# Patient Record
Sex: Female | Born: 1960 | Race: White | Hispanic: No | Marital: Married | Smoking: Never smoker
Health system: Southern US, Community
[De-identification: ages and names within clinical notes are randomized; demographics above are authoritative.]

## PROBLEM LIST (undated history)

## (undated) DIAGNOSIS — E039 Hypothyroidism, unspecified: Secondary | ICD-10-CM

## (undated) DIAGNOSIS — Z9289 Personal history of other medical treatment: Secondary | ICD-10-CM

## (undated) DIAGNOSIS — N3090 Cystitis, unspecified without hematuria: Secondary | ICD-10-CM

## (undated) DIAGNOSIS — R7303 Prediabetes: Secondary | ICD-10-CM

## (undated) DIAGNOSIS — E079 Disorder of thyroid, unspecified: Secondary | ICD-10-CM

## (undated) DIAGNOSIS — F419 Anxiety disorder, unspecified: Secondary | ICD-10-CM

## (undated) DIAGNOSIS — I10 Essential (primary) hypertension: Secondary | ICD-10-CM

## (undated) DIAGNOSIS — K219 Gastro-esophageal reflux disease without esophagitis: Secondary | ICD-10-CM

## (undated) DIAGNOSIS — G4730 Sleep apnea, unspecified: Secondary | ICD-10-CM

## (undated) DIAGNOSIS — F32A Depression, unspecified: Secondary | ICD-10-CM

## (undated) HISTORY — DX: Disorder of thyroid, unspecified: E07.9

## (undated) HISTORY — PX: CHOLECYSTECTOMY: SHX55

## (undated) HISTORY — DX: Cystitis, unspecified without hematuria: N30.90

---
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
## 1997-07-16 HISTORY — PX: ABDOMINAL HYSTERECTOMY: SHX81

## 2004-07-03 ENCOUNTER — Ambulatory Visit: Payer: Self-pay | Admitting: Obstetrics and Gynecology

## 2005-07-26 ENCOUNTER — Ambulatory Visit: Payer: Self-pay | Admitting: Obstetrics and Gynecology

## 2006-07-30 ENCOUNTER — Ambulatory Visit: Payer: Self-pay | Admitting: Obstetrics and Gynecology

## 2006-09-17 ENCOUNTER — Ambulatory Visit: Payer: Self-pay | Admitting: Family Medicine

## 2006-09-23 ENCOUNTER — Ambulatory Visit: Payer: Self-pay | Admitting: Surgery

## 2006-10-25 ENCOUNTER — Ambulatory Visit: Payer: Self-pay | Admitting: Gastroenterology

## 2007-04-09 ENCOUNTER — Ambulatory Visit: Payer: Self-pay | Admitting: Family Medicine

## 2007-05-21 ENCOUNTER — Ambulatory Visit: Payer: Self-pay | Admitting: Surgery

## 2007-08-05 ENCOUNTER — Ambulatory Visit: Payer: Self-pay | Admitting: Obstetrics and Gynecology

## 2008-08-06 ENCOUNTER — Ambulatory Visit: Payer: Self-pay | Admitting: Obstetrics and Gynecology

## 2009-08-11 ENCOUNTER — Ambulatory Visit: Payer: Self-pay | Admitting: Obstetrics and Gynecology

## 2009-08-18 ENCOUNTER — Ambulatory Visit: Payer: Self-pay | Admitting: Obstetrics and Gynecology

## 2010-02-21 ENCOUNTER — Ambulatory Visit: Payer: Self-pay | Admitting: Obstetrics and Gynecology

## 2010-08-15 ENCOUNTER — Ambulatory Visit: Payer: Self-pay | Admitting: Obstetrics and Gynecology

## 2010-12-07 ENCOUNTER — Ambulatory Visit: Payer: Self-pay | Admitting: Otolaryngology

## 2010-12-18 ENCOUNTER — Ambulatory Visit: Payer: Self-pay | Admitting: Otolaryngology

## 2011-07-17 HISTORY — PX: COLONOSCOPY: SHX174

## 2011-08-17 ENCOUNTER — Ambulatory Visit: Payer: Self-pay | Admitting: Obstetrics and Gynecology

## 2011-09-13 ENCOUNTER — Other Ambulatory Visit: Payer: Self-pay | Admitting: Gastroenterology

## 2011-09-13 LAB — CLOSTRIDIUM DIFFICILE BY PCR

## 2011-09-14 LAB — WBCS, STOOL

## 2011-09-15 LAB — STOOL CULTURE

## 2011-10-12 ENCOUNTER — Other Ambulatory Visit: Payer: Self-pay | Admitting: Gastroenterology

## 2011-11-12 ENCOUNTER — Ambulatory Visit: Payer: Self-pay | Admitting: Gastroenterology

## 2012-08-26 ENCOUNTER — Ambulatory Visit: Payer: Self-pay | Admitting: Obstetrics and Gynecology

## 2012-09-02 ENCOUNTER — Ambulatory Visit: Payer: Self-pay | Admitting: Obstetrics and Gynecology

## 2012-12-29 ENCOUNTER — Encounter: Payer: Self-pay | Admitting: General Surgery

## 2012-12-29 ENCOUNTER — Ambulatory Visit (INDEPENDENT_AMBULATORY_CARE_PROVIDER_SITE_OTHER): Payer: Managed Care, Other (non HMO) | Admitting: General Surgery

## 2012-12-29 ENCOUNTER — Other Ambulatory Visit: Payer: Self-pay

## 2012-12-29 VITALS — BP 128/74 | HR 76 | Resp 12 | Ht 69.0 in | Wt 207.0 lb

## 2012-12-29 DIAGNOSIS — N63 Unspecified lump in unspecified breast: Secondary | ICD-10-CM

## 2012-12-29 DIAGNOSIS — N6009 Solitary cyst of unspecified breast: Secondary | ICD-10-CM

## 2012-12-29 DIAGNOSIS — N6002 Solitary cyst of left breast: Secondary | ICD-10-CM

## 2012-12-29 NOTE — Patient Instructions (Addendum)
Patient to return for an left breast biopsy maybe right

## 2012-12-29 NOTE — Progress Notes (Signed)
Patient ID: [name], female   DOB: 1961-02-02, 52 y.o.   MRN: [id number]  Chief Complaint  Patient presents with  . Other    left breast ultrasound    HPI [name] is a 52 y.o. female here today for an left breast ultrasound. Patient had a left breast cyst aspiration in March 2014. Today's exam was to confirm if the area had recurred. Patient states she has a rash on her left nipple That began about 6-8 weeks ago but has significantly resolved for the last week.No family history of breast cancer. HPI  Past Medical History  Diagnosis Date  . Thyroid disease 1975  . Cystitis 1977    Past Surgical History  Procedure Laterality Date  . Colonoscopy  2013    Dr Frederick Peers polyps, FH of colon cancer   . Tonsillectomy  1966  . Abdominal hysterectomy  1999  . Breast biopsy Right 1972    Family History  Problem Relation Age of Onset  . Colon cancer Paternal Uncle 50    had 3 with colon ca  . Colon cancer Paternal Aunt 23  . Lung cancer Maternal Aunt 60  . Colon cancer Maternal Aunt 54    Social History History  Substance Use Topics  . Smoking status: Never Smoker   . Smokeless tobacco: Never Used  . Alcohol Use: Yes    No Known Allergies  Current Outpatient Prescriptions  Medication Sig Dispense Refill  . captopril-hydrochlorothiazide (CAPOZIDE) 25-15 MG per tablet Take 1 tablet by mouth daily.      [name] Kitchen FLUoxetine (PROZAC) 10 MG capsule Take 10 mg by mouth daily.      [name] Kitchen levothyroxine (SYNTHROID, LEVOTHROID) 137 MCG tablet Take 137 mcg by mouth daily before breakfast.       No current facility-administered medications for this visit.    Review of Systems Review of Systems  Constitutional: Negative.   Respiratory: Negative.   Cardiovascular: Negative.     Blood pressure 128/74, pulse 76, resp. rate 12, height 5\' 9"  (1.753 m), weight 207 lb (93.895 kg).  Physical Exam Physical Exam  Constitutional: She appears well-developed and well-nourished.  Cardiovascular:  Normal rate, regular rhythm and normal heart sounds.   Pulmonary/Chest: Breath sounds normal. Right breast exhibits no inverted nipple, no mass, no nipple discharge, no skin change and no tenderness. Left breast exhibits no inverted nipple, no mass, no nipple discharge, no skin change and no tenderness. Breasts are asymmetrical (right breast bigger than left).  Right breast thickening upper outer quadrant  A slight rash on the left areolar tissue in the 6-9 o'clock position. This is very faint, less the patient had identified the area I would not have appreciated. No skin thickening to suggest Paget's. No abnormality of the nipple.  Lymphadenopathy:    She has no cervical adenopathy.    She has no axillary adenopathy.  Neurological: She is alert.  Skin: Skin is warm and dry.    Data Reviewed Ultrasound examination of the right breast an area palpable fullness in the upper or quadrant showed an ill-defined isoechoic area with a heterogeneous echo pattern resting against the pectoralis fascia. This was approximately 12 cm from the nipple and measured 1.23 x 2.23 x 2.42 cm. There was no architectural distortion. The image was not necessarily that of a lipoma or a lymph node. In the right breast at the 1:00 position 10 cm from nipple to focal areas of hyperechoic tissue were identified, the largest measuring less than 8 mm in  diameter.  In the left breast at the 12:00 position 6 cm from the nipple at the site of the previous cystic aspiration was a irregular hypoechoic mass with posterior acoustic enhancement measuring 0.47 x 0.63 x 0.79 cm. Internal echos were appreciated.at the time of the patient's September 17, 2012 exam this area measured 0.37 x 0.61 x 0.66 cm.  Assessment    Recurrent cystic lesion the left breast.  New, ill-defined mass in the right breast.     Plan    Because of the recurrence of left breast cyst and a newly palpable right breast lesion bilateral vacuum biopsies been  recommended.        Earline Mayotte 12/30/2012, 7:50 PM

## 2012-12-30 ENCOUNTER — Encounter: Payer: Self-pay | Admitting: General Surgery

## 2012-12-30 DIAGNOSIS — N63 Unspecified lump in unspecified breast: Secondary | ICD-10-CM | POA: Insufficient documentation

## 2013-01-14 ENCOUNTER — Encounter: Payer: Self-pay | Admitting: General Surgery

## 2013-01-14 ENCOUNTER — Ambulatory Visit (INDEPENDENT_AMBULATORY_CARE_PROVIDER_SITE_OTHER): Payer: Managed Care, Other (non HMO) | Admitting: General Surgery

## 2013-01-14 VITALS — BP 120/78 | HR 80 | Resp 14 | Ht 63.5 in | Wt 215.0 lb

## 2013-01-14 DIAGNOSIS — N6002 Solitary cyst of left breast: Secondary | ICD-10-CM

## 2013-01-14 DIAGNOSIS — N63 Unspecified lump in unspecified breast: Secondary | ICD-10-CM | POA: Insufficient documentation

## 2013-01-14 DIAGNOSIS — N6009 Solitary cyst of unspecified breast: Secondary | ICD-10-CM

## 2013-01-14 HISTORY — PX: BREAST BIOPSY: SHX20

## 2013-01-14 NOTE — Progress Notes (Signed)
Patient ID: [name], female   DOB: 05/22/1961, 52 y.o.   MRN: [id number]  Chief Complaint  Patient presents with  . Procedure    bilateral breast biopsy - encore    HPI [name] is a 52 y.o. female who presents for a bilateral breast biopsy. The left breast has a recurrent complex cystic lesion, and the right breast showed a ill-defined mass in the upper-outer quadrant.  HPI  Past Medical History  Diagnosis Date  . Thyroid disease 1975  . Cystitis 1977    Past Surgical History  Procedure Laterality Date  . Colonoscopy  2013    Dr Frederick Peers polyps, FH of colon cancer   . Tonsillectomy  1966  . Abdominal hysterectomy  1999  . Breast biopsy Right 1972    Family History  Problem Relation Age of Onset  . Colon cancer Paternal Uncle 50    had 3 with colon ca  . Colon cancer Paternal Aunt 73  . Lung cancer Maternal Aunt 60  . Colon cancer Maternal Aunt 61    Social History History  Substance Use Topics  . Smoking status: Never Smoker   . Smokeless tobacco: Never Used  . Alcohol Use: Yes    Allergies  Allergen Reactions  . Cephalosporins Hives  . Sulfa Antibiotics Other (See Comments)    dizziness    Current Outpatient Prescriptions  Medication Sig Dispense Refill  . FLUoxetine (PROZAC) 10 MG capsule Take 10 mg by mouth daily.      Marland Kitchen ibuprofen (ADVIL,MOTRIN) 200 MG tablet Take 400 mg by mouth every 6 (six) hours as needed for pain.      Marland Kitchen levothyroxine (SYNTHROID, LEVOTHROID) 137 MCG tablet Take 137 mcg by mouth daily before breakfast.      . triamterene-hydrochlorothiazide (MAXZIDE-25) 37.5-25 MG per tablet Take 1 tablet by mouth daily.       No current facility-administered medications for this visit.    Review of Systems Review of Systems  Constitutional: Negative.   Respiratory: Negative.   Cardiovascular: Negative.     Blood pressure 120/78, pulse 80, resp. rate 14, height 5' 3.5" (1.613 m), weight 215 lb (97.523 kg).  Physical Exam Physical  Exam No interval change. Data Reviewed Ultrasound examination of the right breast 11:00 position 10 cm from the nipple showed a hypoechoic area measuring 1.2 x 1.3 x 1.9 cm.  10 cc of 0.5% Xylocaine with 0.25% Marcaine with 1-200,000 epinephrine was used for this lesion. Chlor prep was applied to the skin. The right breast was approached first. Under ultrasound guidance the 10-gauge Encor device was placed into the lesion and 12 core samples obtained. Scant bleeding was noted. A postbiopsy clip was placed. The skin defect was closed with benzoin and Steri-Strips followed by Telfa and Tegaderm dressing.  Ultrasound examination ofthe left breast 6 cm from the nipple to 2:00 position a 0.6 x 0.7 x 0.8 cm complex cystic lesion was identified.    10 cc of 0.5% Xylocaine with 0.25% Marcaine with 1-200,000 epinephrine was utilized well tolerated. A small hematoma was evident after the injection of local anesthesia and prior to biopsy and the subcutaneous tissue. The area was prepped with ChloraPrep draped. The 10-gauge Encor device (new handpiece) was advanced through the lesion. 6 core samples were obtained with complete removal. A postbiopsy clip was placed. The skin defect was closed with benzoin and Steri-Strips followed by Telfa and Tegaderm dressing. The patient tolerated the procedure well and was provided with postbiopsy written instructions.  Assessment    Complex cyst left breast, ill-defined mass right breast.     Plan    The patient will be contacted with pathology when available.        Earline Mayotte 01/14/2013, 9:27 PM   Patient returned for left breast dressing change.  Mild hematoma noted area cleaned and redressed with Telfa and Tegaderm. Aware to use ice today and tomorrow then she may switch to heat for comfort. Follow up as scheduled.

## 2013-01-14 NOTE — Patient Instructions (Addendum)

## 2013-01-15 ENCOUNTER — Telehealth: Payer: Self-pay | Admitting: General Surgery

## 2013-01-15 LAB — PATHOLOGY

## 2013-01-15 NOTE — Telephone Encounter (Signed)
The patient was notified that both right and left breast biopsies were benign. She reported no problems with the procedure. She will follow up next week with the nurse for a wound check.

## 2013-01-21 ENCOUNTER — Ambulatory Visit (INDEPENDENT_AMBULATORY_CARE_PROVIDER_SITE_OTHER): Payer: Managed Care, Other (non HMO) | Admitting: *Deleted

## 2013-01-21 DIAGNOSIS — N6002 Solitary cyst of left breast: Secondary | ICD-10-CM

## 2013-01-21 DIAGNOSIS — N6009 Solitary cyst of unspecified breast: Secondary | ICD-10-CM

## 2013-01-21 NOTE — Patient Instructions (Addendum)
The patient is aware that a heating pad may be used for comfort as needed.  Aware of pathology. Follow up as scheduled. 

## 2013-01-21 NOTE — Progress Notes (Addendum)
Patient here today for follow up post bilateral breast biopsy.  No dressing, steristrip in place and aware it may come off in one week.  Minimal bruising noted to right breast. Left breast with moderate bruising/hematoma  The patient is aware that a heating pad may be used for comfort as needed.  Aware of pathology. Follow up as scheduled.

## 2013-02-10 ENCOUNTER — Encounter: Payer: Self-pay | Admitting: General Surgery

## 2013-05-21 ENCOUNTER — Other Ambulatory Visit: Payer: Self-pay

## 2013-08-14 ENCOUNTER — Ambulatory Visit: Payer: Self-pay | Admitting: General Surgery

## 2013-08-17 ENCOUNTER — Encounter: Payer: Self-pay | Admitting: General Surgery

## 2013-08-24 ENCOUNTER — Other Ambulatory Visit: Payer: Managed Care, Other (non HMO)

## 2013-08-24 ENCOUNTER — Encounter: Payer: Self-pay | Admitting: General Surgery

## 2013-08-24 ENCOUNTER — Ambulatory Visit (INDEPENDENT_AMBULATORY_CARE_PROVIDER_SITE_OTHER): Payer: Managed Care, Other (non HMO) | Admitting: General Surgery

## 2013-08-24 VITALS — BP 150/80 | HR 78 | Resp 14 | Ht 63.0 in | Wt 210.0 lb

## 2013-08-24 DIAGNOSIS — N63 Unspecified lump in unspecified breast: Secondary | ICD-10-CM

## 2013-08-24 NOTE — Progress Notes (Addendum)
Patient ID: [name], female   DOB: 1961/01/02, 53 y.o.   MRN: [id number]  Chief Complaint  Patient presents with  . Follow-up    mammogram    HPI [name] is a 53 y.o. female who presents for a breast evaluation. The most recent mammogram and ultrasound was done on 08/14/13.Patient does perform regular self breast checks and gets regular mammograms done.  States she still has an rash and redness around her areolar tissue bilaterally that waxes and wanes in intensity. This is much less prominent today than it was last week by the patient and husband's report.  HPI  Past Medical History  Diagnosis Date  . Thyroid disease 1975  . Cystitis 1977    Past Surgical History  Procedure Laterality Date  . Colonoscopy  2013    Dr Frederick Peers polyps, FH of colon cancer   . Tonsillectomy  1966  . Abdominal hysterectomy  1999  . Breast biopsy Right 1972    Family History  Problem Relation Age of Onset  . Colon cancer Paternal Uncle 50    had 3 with colon ca  . Colon cancer Paternal Aunt 54  . Lung cancer Maternal Aunt 60  . Colon cancer Maternal Aunt 41    Social History History  Substance Use Topics  . Smoking status: Never Smoker   . Smokeless tobacco: Never Used  . Alcohol Use: Yes    Allergies  Allergen Reactions  . Cephalosporins Hives  . Sulfa Antibiotics Other (See Comments)    dizziness    Current Outpatient Prescriptions  Medication Sig Dispense Refill  . ESTRACE VAGINAL 0.1 MG/GM vaginal cream       . FLUoxetine (PROZAC) 10 MG capsule Take 10 mg by mouth daily.      Marland Kitchen ibuprofen (ADVIL,MOTRIN) 200 MG tablet Take 400 mg by mouth every 6 (six) hours as needed for pain.      Marland Kitchen levothyroxine (SYNTHROID, LEVOTHROID) 137 MCG tablet Take 137 mcg by mouth daily before breakfast.      . triamterene-hydrochlorothiazide (MAXZIDE-25) 37.5-25 MG per tablet Take 1 tablet by mouth daily.       No current facility-administered medications for this visit.    Review of  Systems Review of Systems  Constitutional: Negative.   Respiratory: Negative.   Cardiovascular: Negative.     Blood pressure 150/80, pulse 78, resp. rate 14, height 5\' 3"  (1.6 m), weight 210 lb (95.255 kg).  Physical Exam Physical Exam  Constitutional: She is oriented to person, place, and time. She appears well-developed and well-nourished.  Eyes: Conjunctivae are normal.  Neck: Neck supple.  Cardiovascular: Normal rate, regular rhythm and normal heart sounds.   Pulmonary/Chest: Breath sounds normal. Right breast exhibits no inverted nipple, no mass, no nipple discharge, no skin change and no tenderness. Left breast exhibits no inverted nipple, no mass, no nipple discharge, no skin change and no tenderness.  There is a vague, patchy faintly erythematous rash without associated skin changes involving 6-7 cm area around the areola bilaterally. Once again this is very faint.  Lymphadenopathy:    She has no cervical adenopathy.    She has no axillary adenopathy.  Neurological: She is alert and oriented to person, place, and time.  Skin: Skin is warm and dry.    Data Reviewed PATH REPORT.SITE OF ORIGIN SPEC  Comment   Comments: Material submitted: Marland Kitchen PART A: RIGHT BREAST 11:00 PART B: LEFT BREAST 12:00 PATH REPORT.FINAL DX SPEC  Comment   Comments: Clinician provided ICD-9:  611.72 ; Lump or mass in breast PATH REPORT.FINAL DX SPEC  Comment   Comments:  Diagnosis: Part A: RIGHT BREAST AT 11 O'CLOCK, ULTRASOUND-GUIDED VACUUM- ASSISTED CORE BIOPSY: - FIBROTIC BREAST TISSUE WITH BENIGN PROLIFERATIVE CHANGES. - NO ATYPIA OR MALIGNANCY. Part B: LEFT BREAST AT 12 O'CLOCK, ULTRASOUND-GUIDED VACUUM- ASSISTED CORE BIOPSY: - CLUSTERS OF APOCRINE MICROCYSTS IN 2 PIECES, 3 MM AND 2 MM. - NO ATYPIA OR MALIGNANCY. NOTE: In A there is usual ductal hyperplasia and columnar cell hyperplasia, as well as a few dilated sclerotic ducts and a few apocrine microcysts. In B there is mild usual ductal  hyperplasia. The findings were discussed with Dr. Lemar LivingsByrnett on 01/15/13 at 2:25 PM. MSO/01/15/2013 Mammograms were reviewed. The radiologist reports a persistent asymmetric density in the left breast the 12:00 position adjacent to the previous biopsy clip. No mammographic abnormality of the right breast was reported.  Ultrasound report dated August 14, 2013 was reviewed. Radiologist stated that the patient undergone core needle biopsies. This is incorrect. The patient underwent vacuum-assisted biopsy with a Encor device, 9-gauge samples.  The radiologist was concerned regarding ultrasound abnormality in the 12:00 position the left breast which was by her determination larger. She did not take into account the hematoma the patient experienced after her previous core biopsy with benign findings.  The right breast reportedly shows a 2 x 5 x 5 mm hypoechoic nodule at 11:30 o'clock possibly representing a cyst. Recommendations are confusing as it's unclear whether she is recommending biopsy of right, left or both breasts. BI-RAD-4.  Ultrasound exami nation of the left breast (no charge) in the 12:00 position 6 cm from the nipple shows a 0.4 x 1.0 x 1.0 cm hypoechoic area as described in the radiologist report. This corresponds in space to the  location of the previously completed 9-gauge core biopsy with benign findings as noted above. This is likely residual changes from the previous hematoma.   Assessment    Benign breast exam.  Intermittent rash involving the nipple/areolar area, no evidence of Paget's disease.    Plan    Options for management were reviewed: 1) repeat biopsy as recommended by the radiologist versus 2) follow up ultrasound examination and 3-6 months to assure stability of the area thought to represent residual hematoma. Pros and cons of both were reviewed with the patient and her husband. Short-term follow up as acceptable the patient, arrangements were made for office follow up  ultrasound in 3 months. She was advised that should she desire early biopsy she should notify the office and this will be arranged.   The radiologist will be asked to clarify her recommendations in regards to benign right breast mammograms on her ultrasound report of a 5 mm lesion.  Dermatologic assessment has been recommended.       Earline MayotteByrnett, Carren Blakley W 08/24/2013, 8:38 PM   I spoke with the attending by phone regarding her recommendations regarding the right breast. She reported to me that if the left breast biopsy was found to be malignant, she would investigate the right breast lesion earlier, prior to any treatment, but otherwise observation was appropriate. This will be completed through the office.

## 2013-08-24 NOTE — Patient Instructions (Signed)
Patient to return in three months.  

## 2013-08-31 ENCOUNTER — Telehealth: Payer: Self-pay | Admitting: General Surgery

## 2013-08-31 NOTE — Telephone Encounter (Signed)
Right/ left error in body of noted identified by patient. Corrected.  Images were of the LEFT breast.  F/U as scheduled.

## 2013-11-23 ENCOUNTER — Ambulatory Visit: Payer: Managed Care, Other (non HMO) | Admitting: General Surgery

## 2013-11-25 ENCOUNTER — Encounter: Payer: Self-pay | Admitting: General Surgery

## 2013-11-25 ENCOUNTER — Ambulatory Visit (INDEPENDENT_AMBULATORY_CARE_PROVIDER_SITE_OTHER): Payer: Managed Care, Other (non HMO) | Admitting: General Surgery

## 2013-11-25 ENCOUNTER — Other Ambulatory Visit: Payer: Managed Care, Other (non HMO)

## 2013-11-25 VITALS — BP 112/66 | HR 70 | Resp 12 | Ht 65.0 in | Wt 199.0 lb

## 2013-11-25 DIAGNOSIS — N63 Unspecified lump in unspecified breast: Secondary | ICD-10-CM

## 2013-11-25 NOTE — Patient Instructions (Signed)
Continue self breast exams. Call office for any new breast issues or concerns. 

## 2013-11-25 NOTE — Progress Notes (Addendum)
Patient ID: [name], female   DOB: 16-Aug-1960, 53 y.o.   MRN: [id number]  Chief Complaint  Patient presents with  . Follow-up    office ultrasound    HPI Melissa KnackSonia Nichols is a 53 y.o. female.  Here today for follow up left breast office ultrasound. No new breast issues.   HPI  Past Medical History  Diagnosis Date  . Thyroid disease 1975  . Cystitis 1977    Past Surgical History  Procedure Laterality Date  . Colonoscopy  2013    Dr Frederick Peersh-no polyps, FH of colon cancer   . Tonsillectomy  1966  . Abdominal hysterectomy  1999  . Breast biopsy Right 1972    Family History  Problem Relation Age of Onset  . Colon cancer Paternal Uncle 50    had 3 with colon ca  . Colon cancer Paternal Aunt 7580  . Lung cancer Maternal Aunt 60  . Colon cancer Maternal Aunt 5760    Social History History  Substance Use Topics  . Smoking status: Never Smoker   . Smokeless tobacco: Never Used  . Alcohol Use: Yes    Allergies  Allergen Reactions  . Cephalosporins Hives  . Sulfa Antibiotics Other (See Comments)    dizziness    Current Outpatient Prescriptions  Medication Sig Dispense Refill  . FLUoxetine (PROZAC) 10 MG capsule Take 10 mg by mouth daily.      Marland Kitchen. ibuprofen (ADVIL,MOTRIN) 200 MG tablet Take 400 mg by mouth every 6 (six) hours as needed for pain.      Marland Kitchen. levothyroxine (SYNTHROID, LEVOTHROID) 137 MCG tablet Take 137 mcg by mouth daily before breakfast.      . triamterene-hydrochlorothiazide (MAXZIDE-25) 37.5-25 MG per tablet Take 1 tablet by mouth daily.       No current facility-administered medications for this visit.    Review of Systems Review of Systems  Constitutional: Negative.   Respiratory: Negative.   Cardiovascular: Negative.     Blood pressure 112/66, pulse 70, resp. rate 12, height 5\' 5"  (1.651 m), weight 199 lb (90.266 kg).  Physical Exam Physical Exam  Constitutional: She is oriented to person, place, and time. She appears well-developed and well-nourished.   Neck: Neck supple. No thyromegaly present.  Cardiovascular: Normal rate, regular rhythm and normal heart sounds.   No murmur heard. Pulmonary/Chest: Effort normal and breath sounds normal. Right breast exhibits no inverted nipple, no mass, no nipple discharge, no skin change and no tenderness. Left breast exhibits no inverted nipple, no mass, no nipple discharge, no skin change and no tenderness.    Plate like thickening upper inner quadrant 10 cm from nipple of right breast.   Plate like thickening upper inner quadrant of left breast.     Lymphadenopathy:    She has no cervical adenopathy.    She has no axillary adenopathy.  Neurological: She is alert and oriented to person, place, and time.  Skin: Skin is warm and dry.    Data Reviewed Ultrasound examination of the left breast in the upper inner quadrant an area of focal thickening showed hyper-echoic tissue as well as isoechoic tissue just below the level of the dermis. In aggregate this measured at 1.5 x 1.3 cm in maximal diameter, 0.5 cm in thickness. Vascular flow adjacent to this there is appreciated.  At the 12:00 position at the site of previous biopsy a 0.3 x 0.4 x 0.5 cm hypoechoic area within acoustic enhancement is noted.  Assessment    Stable breast exam.  Plan    We'll plan for a followup examination with bilateral mammograms in 8 months.     PCP: Bettey MareBabaoff, Marc E   Crue Otero W Maiyah Goyne 11/26/2013, 1:05 PM

## 2014-05-17 ENCOUNTER — Encounter: Payer: Self-pay | Admitting: General Surgery

## 2014-08-16 ENCOUNTER — Ambulatory Visit: Payer: Self-pay | Admitting: General Surgery

## 2014-08-17 ENCOUNTER — Encounter: Payer: Self-pay | Admitting: General Surgery

## 2014-08-19 ENCOUNTER — Telehealth: Payer: Self-pay | Admitting: *Deleted

## 2014-08-19 NOTE — Telephone Encounter (Signed)
-----   Message from Melissa MayotteJeffrey W [name], MD sent at 08/19/2014  7:22 AM EST ----- Notify the patient I reviewed her mammograms and repeat [facility] ultrasound. She may benefit from repeat left biopsy.  Can be completed this afternoon, tomorrow or at the time of her scheduled appointment next week.

## 2014-08-19 NOTE — Telephone Encounter (Signed)
Notified patient as instructed, biopsy at next appt, patient agrees

## 2014-08-24 ENCOUNTER — Ambulatory Visit (INDEPENDENT_AMBULATORY_CARE_PROVIDER_SITE_OTHER): Payer: Managed Care, Other (non HMO) | Admitting: General Surgery

## 2014-08-24 ENCOUNTER — Other Ambulatory Visit: Payer: Managed Care, Other (non HMO)

## 2014-08-24 ENCOUNTER — Encounter: Payer: Self-pay | Admitting: General Surgery

## 2014-08-24 VITALS — BP 122/68 | HR 78 | Resp 16 | Ht 65.0 in | Wt 205.0 lb

## 2014-08-24 DIAGNOSIS — N6320 Unspecified lump in the left breast, unspecified quadrant: Secondary | ICD-10-CM

## 2014-08-24 DIAGNOSIS — N63 Unspecified lump in breast: Secondary | ICD-10-CM

## 2014-08-24 NOTE — Patient Instructions (Signed)
Follow up here in 6 months for office ultrasound.

## 2014-08-24 NOTE — Progress Notes (Signed)
Patient ID: [name], female   DOB: 1961-06-09, 54 y.o.   MRN: [id number]  Chief Complaint  Patient presents with  . Procedure    left breast biopsy    HPI [name] is a 54 y.o. female here today for a follow up from mammogram and ultrasound done at [facility] on 08/16/14. HPI  Past Medical History  Diagnosis Date  . Thyroid disease 1975  . Cystitis 1977    Past Surgical History  Procedure Laterality Date  . Colonoscopy  2013    Dr Frederick Peers polyps, FH of colon cancer   . Tonsillectomy  1966  . Abdominal hysterectomy  1999  . Breast biopsy Right 1972    Family History  Problem Relation Age of Onset  . Colon cancer Paternal Uncle 50    had 3 with colon ca  . Colon cancer Paternal Aunt 32  . Lung cancer Maternal Aunt 60  . Colon cancer Maternal Aunt 23    Social History History  Substance Use Topics  . Smoking status: Never Smoker   . Smokeless tobacco: Never Used  . Alcohol Use: Yes    Allergies  Allergen Reactions  . Cephalosporins Hives  . Sulfa Antibiotics Other (See Comments)    dizziness    Current Outpatient Prescriptions  Medication Sig Dispense Refill  . FLUoxetine (PROZAC) 10 MG capsule Take 10 mg by mouth daily.    Marland Kitchen ibuprofen (ADVIL,MOTRIN) 200 MG tablet Take 400 mg by mouth every 6 (six) hours as needed for pain.    Marland Kitchen levothyroxine (SYNTHROID, LEVOTHROID) 137 MCG tablet Take 137 mcg by mouth daily before breakfast.    . triamterene-hydrochlorothiazide (MAXZIDE-25) 37.5-25 MG per tablet Take 1 tablet by mouth daily.     No current facility-administered medications for this visit.    Review of Systems Review of Systems  Constitutional: Negative.   Respiratory: Negative.   Cardiovascular: Negative.     Blood pressure 122/68, pulse 78, resp. rate 16, height  (1.651 m), weight 205 lb (92.987 kg).  Physical Exam Physical Exam  Constitutional: She is oriented to person, place, and time. She appears well-developed and well-nourished.   Eyes: Conjunctivae are normal. No scleral icterus.  Neck: Neck supple.  Cardiovascular: Normal rate, regular rhythm and normal heart sounds.   Pulmonary/Chest: Breath sounds normal. Right breast exhibits no inverted nipple, no mass, no nipple discharge, no skin change and no tenderness. Left breast exhibits no inverted nipple, no nipple discharge, no skin change and no tenderness. Breasts are asymmetrical (Left breast 1/2 cup sizes bigger than right ).  Abdominal: Soft. Bowel sounds are normal. There is no tenderness.  Lymphadenopathy:    She has no cervical adenopathy.    She has no axillary adenopathy.  Neurological: She is alert and oriented to person, place, and time.  Skin: Skin is warm and dry.    Data Reviewed 08/16/2014 mammogram and ultrasound completed at [facility] was reviewed and compared to previous studies. Mammograms essentially unchanged. Ultrasound suggested slight enlargement of the ill-defined hypoechoic area. BI-RADS-4.  Repeat ultrasound of the left breast shows a 0.5 x 0.9 x 1.6 cm  irregular hypoechoic area with posterior acoustic enhancement.   No corresponding mammographic abnormality is noted at the 12:00 position, 6 cm from the nipple. This is adjacent to the area of the previously placed clip. That procedure did result in a small hematoma clinically evident after the biopsy was completed. At the time of her May 2005 examination this measured less than  1 cm in diameter. BI-RADS-3.  Assessment    Ill-defined ultrasound area at the site of previous biopsy showing microcysts.    Plan    Options for management were reviewed: 1) early repeat ultrasound-guided biopsy versus 2) repeat ultrasound examination in 6 months. The pros and cons of each were reviewed. At this time the patient is comfortable with a 6 month follow-up. If she does have a change of heart she was encouraged to call and we'll arrange for office biopsy. She will follow up here in 6 months with a Left breast  ultrasound.     PCP:  Dewitt HoesBabaoff, Marcus Elijah  Chaquetta Schlottman W 08/25/2014, 5:10 PM

## 2015-01-12 ENCOUNTER — Encounter: Payer: Self-pay | Admitting: *Deleted

## 2015-02-22 ENCOUNTER — Ambulatory Visit (INDEPENDENT_AMBULATORY_CARE_PROVIDER_SITE_OTHER): Payer: Managed Care, Other (non HMO) | Admitting: General Surgery

## 2015-02-22 ENCOUNTER — Ambulatory Visit: Payer: Managed Care, Other (non HMO)

## 2015-02-22 ENCOUNTER — Encounter: Payer: Self-pay | Admitting: General Surgery

## 2015-02-22 VITALS — BP 124/78 | HR 74 | Resp 12 | Ht 65.0 in | Wt 207.0 lb

## 2015-02-22 DIAGNOSIS — N6320 Unspecified lump in the left breast, unspecified quadrant: Secondary | ICD-10-CM

## 2015-02-22 DIAGNOSIS — N63 Unspecified lump in breast: Secondary | ICD-10-CM | POA: Diagnosis not present

## 2015-02-22 NOTE — Patient Instructions (Addendum)
Patient will be asked to return to the office in  Six months with a bilateral screening mammogra

## 2015-02-22 NOTE — Progress Notes (Signed)
Patient ID: [name], female   DOB: Feb 28, 1961, 54 y.o.   MRN: [id number]  Chief Complaint  Patient presents with  . Follow-up    left breast ultrasound    HPI [name] is a 54 y.o. female here today for a left breast ultrasound . Patient states she is doing well at this time.  HPI  Past Medical History  Diagnosis Date  . Thyroid disease 1975  . Cystitis 1977    Past Surgical History  Procedure Laterality Date  . Colonoscopy  2013    Dr Frederick Peers polyps, FH of colon cancer   . Tonsillectomy  1966  . Abdominal hysterectomy  1999  . Breast biopsy Right 1972    Family History  Problem Relation Age of Onset  . Colon cancer Paternal Uncle 50    had 3 with colon ca  . Colon cancer Paternal Aunt 81  . Lung cancer Maternal Aunt 60  . Colon cancer Maternal Aunt 61    Social History History  Substance Use Topics  . Smoking status: Never Smoker   . Smokeless tobacco: Never Used  . Alcohol Use: Yes    Allergies  Allergen Reactions  . Cephalosporins Hives  . Sulfa Antibiotics Other (See Comments)    dizziness    Current Outpatient Prescriptions  Medication Sig Dispense Refill  . FLUoxetine (PROZAC) 10 MG capsule Take 10 mg by mouth daily.    Marland Kitchen ibuprofen (ADVIL,MOTRIN) 200 MG tablet Take 400 mg by mouth every 6 (six) hours as needed for pain.    Marland Kitchen levothyroxine (SYNTHROID, LEVOTHROID) 137 MCG tablet Take 137 mcg by mouth daily before breakfast.    . triamterene-hydrochlorothiazide (MAXZIDE-25) 37.5-25 MG per tablet Take 1 tablet by mouth daily.     No current facility-administered medications for this visit.    Review of Systems Review of Systems  Blood pressure 124/78, pulse 74, resp. rate 12, height 5\' 5"  (1.651 m), weight 207 lb (93.895 kg).  Physical Exam Physical Exam  Constitutional: She is oriented to person, place, and time. She appears well-developed and well-nourished.  Eyes: Conjunctivae are normal. No scleral icterus.  Neck: Neck supple.   Cardiovascular: Normal rate, regular rhythm and normal heart sounds.   Pulmonary/Chest: Effort normal and breath sounds normal. Right breast exhibits no inverted nipple, no mass, no nipple discharge, no skin change and no tenderness. Left breast exhibits no inverted nipple, no nipple discharge, no skin change and no tenderness.    Lymphadenopathy:    She has no cervical adenopathy.    She has no axillary adenopathy.  Neurological: She is alert and oriented to person, place, and time.  Skin: Skin is warm and dry.    Data Reviewed Repeat ultrasound was completed to reassess the area of previous biopsy. In the past this measured up to 1.6 cm an area of architectural distortion.  Examination of the left breast at the 12:00 position shows a marked decrease in the size of architectural distortion 6 cm from the nipple. Today this hypoechoic area with faint acoustic shadowing measures 0.47 x 0.51 x 0.62 cm. Just cephalad to this 10 cm from the nipple a small complex cyst measuring up to 0.34 cm is noted as well as a minimally prominent duct measuring 0.11 cm in diameter and in greatest dimension 0.4 cm in length. No vascular flow was noted in any of the lesions. No axillary adenopathy was identified. BI-RADS-3.  Assessment    Continuing resolution of postbiopsy changes.  Plan    We'll plan for diagnostic mammograms in 6 months with a repeat office visit at that time. PCP:  Joneen Roach 02/22/2015, 12:39 PM

## 2015-06-29 ENCOUNTER — Other Ambulatory Visit: Payer: Self-pay | Admitting: *Deleted

## 2015-06-29 DIAGNOSIS — N63 Unspecified lump in unspecified breast: Secondary | ICD-10-CM

## 2015-07-06 ENCOUNTER — Encounter: Payer: Self-pay | Admitting: *Deleted

## 2015-07-17 HISTORY — PX: BREAST BIOPSY: SHX20

## 2015-08-18 ENCOUNTER — Ambulatory Visit
Admission: RE | Admit: 2015-08-18 | Discharge: 2015-08-18 | Disposition: A | Discharge status: Home / Self Care | Payer: Managed Care, Other (non HMO) | Source: Ambulatory Visit | Attending: General Surgery | Admitting: General Surgery

## 2015-08-18 ENCOUNTER — Telehealth: Payer: Self-pay | Admitting: *Deleted

## 2015-08-18 ENCOUNTER — Other Ambulatory Visit: Payer: Self-pay | Admitting: General Surgery

## 2015-08-18 DIAGNOSIS — N63 Unspecified lump in unspecified breast: Secondary | ICD-10-CM

## 2015-08-18 NOTE — Telephone Encounter (Signed)
Patient was advised and verbalized understanding, she wishes to discuss all options at time of her appointment next week on 08/24/15.

## 2015-08-18 NOTE — Telephone Encounter (Signed)
-----   Message from Melissa Mayotte, MD sent at 08/18/2015  1:26 PM EST ----- These notify the patient that I reviewed the recent mammograms and ultrasound. They have recommended a repeat biopsy, and this can be completed at her office visit scheduled on February 8 of she desires. We will certainly discuss her options at that time.

## 2015-08-24 ENCOUNTER — Other Ambulatory Visit: Payer: Self-pay

## 2015-08-24 ENCOUNTER — Ambulatory Visit (INDEPENDENT_AMBULATORY_CARE_PROVIDER_SITE_OTHER): Payer: Managed Care, Other (non HMO) | Admitting: General Surgery

## 2015-08-24 VITALS — BP 128/76 | HR 88 | Resp 14 | Ht 67.0 in | Wt 208.0 lb

## 2015-08-24 DIAGNOSIS — N6320 Unspecified lump in the left breast, unspecified quadrant: Secondary | ICD-10-CM

## 2015-08-24 DIAGNOSIS — N63 Unspecified lump in breast: Secondary | ICD-10-CM

## 2015-08-24 NOTE — Patient Instructions (Signed)

## 2015-08-24 NOTE — Progress Notes (Signed)
Patient ID: [name], female   DOB: 1961/01/29, 55 y.o.   MRN: [id number]  Chief Complaint  Patient presents with  . Follow-up    mammogram    HPI [name] is a 55 y.o. female who presents for a breast evaluation. The most recent mammogram and ultrasound was done on 08/18/15.  Patient does perform regular self breast checks and gets regular mammograms done.  Patient states she had a rash around both nipple about two weeks ago and was given some cream from her doctor and the areas have cleared up. I personally reviewed the patient's history. HPI  Past Medical History  Diagnosis Date  . Thyroid disease 1975  . Cystitis 1977    Past Surgical History  Procedure Laterality Date  . Colonoscopy  2013    Dr Frederick Peers polyps, FH of colon cancer   . Tonsillectomy  1966  . Abdominal hysterectomy  1999  . Breast biopsy Right 1972    neg  . Breast biopsy Right 01/14/2013    neg  . Breast biopsy Left 01/14/2013    neg    Family History  Problem Relation Age of Onset  . Colon cancer Paternal Uncle 50    had 3 with colon ca  . Colon cancer Paternal Aunt 34  . Lung cancer Maternal Aunt 60  . Colon cancer Maternal Aunt 36    Social History Social History  Substance Use Topics  . Smoking status: Never Smoker   . Smokeless tobacco: Never Used  . Alcohol Use: Yes    Allergies  Allergen Reactions  . Cephalosporins Hives  . Sulfa Antibiotics Other (See Comments)    dizziness    Current Outpatient Prescriptions  Medication Sig Dispense Refill  . cholecalciferol (VITAMIN D) 1000 units tablet Take 1,000 Units by mouth daily.    Marland Kitchen FLUoxetine (PROZAC) 10 MG capsule Take 10 mg by mouth daily.    Marland Kitchen ibuprofen (ADVIL,MOTRIN) 200 MG tablet Take 400 mg by mouth every 6 (six) hours as needed for pain.    Marland Kitchen levothyroxine (SYNTHROID, LEVOTHROID) 137 MCG tablet Take 137 mcg by mouth daily before breakfast.    . Multiple Vitamins-Minerals (MULTIVITAMIN WITH MINERALS) tablet Take 1 tablet by  mouth daily.    Marland Kitchen triamcinolone ointment (KENALOG) 0.1 %     . triamterene-hydrochlorothiazide (MAXZIDE-25) 37.5-25 MG per tablet Take 1 tablet by mouth daily.     No current facility-administered medications for this visit.    Review of Systems Review of Systems  Constitutional: Negative.   Respiratory: Negative.   Cardiovascular: Negative.     Blood pressure 128/76, pulse 88, resp. rate 14, height  (1.702 m), weight 208 lb (94.348 kg).  Physical Exam Physical Exam  Constitutional: She is oriented to person, place, and time. She appears well-developed and well-nourished.  Eyes: Conjunctivae are normal. No scleral icterus.  Neck: Neck supple.  Cardiovascular: Normal rate, regular rhythm and normal heart sounds.   Pulmonary/Chest: Effort normal and breath sounds normal. Right breast exhibits no inverted nipple, no mass, no nipple discharge, no skin change and no tenderness. Left breast exhibits no inverted nipple, no mass, no nipple discharge, no skin change and no tenderness.  Lymphadenopathy:    She has no cervical adenopathy.    She has no axillary adenopathy.  Neurological: She is alert and oriented to person, place, and time.  Skin: Skin is warm and dry.    Data Reviewed Bilateral mammograms dated 08/18/2015 stable biopsy clips. The area of  the left 12:00 lesion continues to show focal distortion.  Ultrasound reported an increase in size of a hypoechoic area up to 1.7 cm in diameter for which repeat biopsy was recommended.  Ultrasound examination of the left breast in the 12:00 position showed a ill-defined hypoechoic area with focal acoustic shadowing measuring 0.5 x 1.0 x 1.61 cm at the 12:00 position, 6 cm from the nipple. By red-4.  The patient was amenable to repeat biopsy. A 10-gauge Encor device was passed into the area and approximately 8 core samples obtained with complete resolution. A postbiopsy clip was placed. Can defect was closed with benzoin and  Steri-Strips followed by Telfa and Tegaderm dressing. The procedure was well tolerated.  Assessment    Focal abnormality at site of previous hematoma, modest increase in size over the past 6 months.    Plan    Postoperative wound care was reviewed. The patient will be contacted when pathology is available. Assuming the 9 results we'll arrange for a follow-up examination in one year.     PCP:  Kandyce Rud This information has been scribed by Ples Specter CMA.    [name] 08/25/2015, 4:28 PM

## 2015-08-25 DIAGNOSIS — N6320 Unspecified lump in the left breast, unspecified quadrant: Secondary | ICD-10-CM | POA: Insufficient documentation

## 2016-06-21 ENCOUNTER — Other Ambulatory Visit: Payer: Self-pay

## 2016-06-21 DIAGNOSIS — Z1231 Encounter for screening mammogram for malignant neoplasm of breast: Secondary | ICD-10-CM

## 2016-08-22 ENCOUNTER — Ambulatory Visit
Admission: RE | Admit: 2016-08-22 | Discharge: 2016-08-22 | Disposition: A | Discharge status: Home / Self Care | Payer: Managed Care, Other (non HMO) | Source: Ambulatory Visit | Attending: General Surgery | Admitting: General Surgery

## 2016-08-22 DIAGNOSIS — Z1231 Encounter for screening mammogram for malignant neoplasm of breast: Secondary | ICD-10-CM | POA: Insufficient documentation

## 2016-08-27 ENCOUNTER — Ambulatory Visit (INDEPENDENT_AMBULATORY_CARE_PROVIDER_SITE_OTHER): Payer: Managed Care, Other (non HMO) | Admitting: General Surgery

## 2016-08-27 ENCOUNTER — Encounter: Payer: Self-pay | Admitting: General Surgery

## 2016-08-27 VITALS — BP 122/70 | HR 68 | Resp 12 | Ht 64.0 in | Wt 217.0 lb

## 2016-08-27 DIAGNOSIS — Z1231 Encounter for screening mammogram for malignant neoplasm of breast: Secondary | ICD-10-CM

## 2016-08-27 NOTE — Progress Notes (Signed)
Patient ID: [name], female   DOB: [birth year], 56 y.o.   MRN: [id number]  Chief Complaint  Patient presents with  . Follow-up    HPI [name] is a 56 y.o. female who presents for a breast evaluation. The most recent mammogram was done on 08/22/2016.  Patient does perform regular self breast checks and gets regular mammograms done.    HPI  Past Medical History:  Diagnosis Date  . Cystitis 1977  . Thyroid disease 1975    Past Surgical History:  Procedure Laterality Date  . ABDOMINAL HYSTERECTOMY  1999  . BREAST BIOPSY Right 1972   neg  . BREAST BIOPSY Right 01/14/2013   neg  . BREAST BIOPSY Left 01/14/2013   neg  . BREAST BIOPSY Left 2017   NEG  . COLONOSCOPY  2013   Dr Frederick Peers polyps, FH of colon cancer   . TONSILLECTOMY  1966    Family History  Problem Relation Age of Onset  . Colon cancer Paternal Uncle 50    had 3 with colon ca  . Colon cancer Paternal Aunt 52  . Lung cancer Maternal Aunt 60  . Colon cancer Maternal Aunt 60  . Breast cancer Neg Hx     Social History Social History  Substance Use Topics  . Smoking status: Never Smoker  . Smokeless tobacco: Never Used  . Alcohol use Yes    Allergies  Allergen Reactions  . Cephalosporins Hives  . Sulfa Antibiotics Other (See Comments)    dizziness    Current Outpatient Prescriptions  Medication Sig Dispense Refill  . cholecalciferol (VITAMIN D) 1000 units tablet Take 1,000 Units by mouth daily.    Marland Kitchen FLUoxetine (PROZAC) 10 MG capsule Take 10 mg by mouth daily.    Marland Kitchen GLUCOSAMINE HCL PO Take by mouth.    Marland Kitchen ibuprofen (ADVIL,MOTRIN) 200 MG tablet Take 400 mg by mouth every 6 (six) hours as needed for pain.    Marland Kitchen levothyroxine (SYNTHROID, LEVOTHROID) 137 MCG tablet Take 137 mcg by mouth daily before breakfast.    . Multiple Vitamins-Minerals (MULTIVITAMIN WITH MINERALS) tablet Take 1 tablet by mouth daily.    Marland Kitchen triamcinolone ointment (KENALOG) 0.1 %     . triamterene-hydrochlorothiazide (MAXZIDE-25)  37.5-25 MG per tablet Take 1 tablet by mouth daily.     No current facility-administered medications for this visit.     Review of Systems Review of Systems  Constitutional: Negative.   Respiratory: Negative.   Cardiovascular: Negative.     Blood pressure 122/70, pulse 68, resp. rate 12, height 5\' 4"  (1.626 m), weight 217 lb (98.4 kg).  Physical Exam Physical Exam  Constitutional: She is oriented to person, place, and time. She appears well-developed.  Eyes: Conjunctivae are normal. No scleral icterus.  Neck: Neck supple.  Cardiovascular: Normal rate, regular rhythm and normal heart sounds.   Pulmonary/Chest: Effort normal and breath sounds normal. Right breast exhibits no inverted nipple, no mass, no nipple discharge, no skin change and no tenderness (left breast bigger than right breast). Left breast exhibits no inverted nipple, no mass, no nipple discharge, no skin change and no tenderness. Breasts are asymmetrical.  Lymphadenopathy:    She has no cervical adenopathy.    She has no axillary adenopathy.  Neurological: She is alert and oriented to person, place, and time.  Skin: Skin is warm and dry.    Data Reviewed Screening mammogram dated 08/22/2016 was reviewed. Fatty replaced breast. No interval change and previously placed biopsy clips. BI-RADS-1.  Assessment    Benign breast exam.    Plan    Patient was offered the opportunity to have her annual screening exam and mammograms arranged with her PCP. She would like to return here which I'm fine with.  She was notified that Dr. Lutricia FeilPaul Oh has left the area and if she would like she may have a "screening colonoscopy based on family history through this office if she desires.     Patient will be asked to return to the office in one year with a bilateral screening mammogram.  This information has been scribed by Ples SpecterJessica Qualls CMA.   Melissa MayotteByrnett, [name] 08/27/2016, 3:11 PM

## 2016-08-27 NOTE — Patient Instructions (Signed)
Patient will be asked to return to the office in one year with a bilateral screening mammogram. 

## 2017-06-27 ENCOUNTER — Other Ambulatory Visit: Payer: Self-pay

## 2017-06-27 DIAGNOSIS — Z1231 Encounter for screening mammogram for malignant neoplasm of breast: Secondary | ICD-10-CM

## 2017-08-29 ENCOUNTER — Ambulatory Visit
Admission: RE | Admit: 2017-08-29 | Discharge: 2017-08-29 | Disposition: A | Discharge status: Home / Self Care | Payer: 59 | Source: Ambulatory Visit | Attending: General Surgery | Admitting: General Surgery

## 2017-08-29 ENCOUNTER — Ambulatory Visit: Payer: Managed Care, Other (non HMO) | Admitting: General Surgery

## 2017-08-29 ENCOUNTER — Other Ambulatory Visit: Payer: Self-pay | Admitting: General Surgery

## 2017-08-29 DIAGNOSIS — Z1231 Encounter for screening mammogram for malignant neoplasm of breast: Secondary | ICD-10-CM | POA: Insufficient documentation

## 2017-08-29 DIAGNOSIS — N6489 Other specified disorders of breast: Secondary | ICD-10-CM

## 2017-08-29 DIAGNOSIS — R928 Other abnormal and inconclusive findings on diagnostic imaging of breast: Secondary | ICD-10-CM

## 2017-09-03 ENCOUNTER — Encounter: Payer: Self-pay | Admitting: General Surgery

## 2017-09-03 ENCOUNTER — Ambulatory Visit: Payer: 59 | Admitting: General Surgery

## 2017-09-03 ENCOUNTER — Inpatient Hospital Stay: Payer: Self-pay

## 2017-09-03 VITALS — BP 128/74 | HR 72 | Resp 12 | Ht 65.0 in | Wt 201.0 lb

## 2017-09-03 DIAGNOSIS — N6311 Unspecified lump in the right breast, upper outer quadrant: Secondary | ICD-10-CM

## 2017-09-03 DIAGNOSIS — R928 Other abnormal and inconclusive findings on diagnostic imaging of breast: Secondary | ICD-10-CM

## 2017-09-03 NOTE — Progress Notes (Signed)
Patient ID: [name], female   DOB: Sep 29, 1960, 57 y.o.   MRN: [id number]  Chief Complaint  Patient presents with  . Follow-up    HPI [name] is a 57 y.o. female who presents for a breast evaluation. The most recent mammogram was done on 08/23/2017.  Patient does perform regular self breast checks and gets regular mammograms done.    HPI  Past Medical History:  Diagnosis Date  . Cystitis 1977  . Thyroid disease 1975    Past Surgical History:  Procedure Laterality Date  . ABDOMINAL HYSTERECTOMY  1999  . BREAST BIOPSY Right 1972   neg  . BREAST BIOPSY Right 01/14/2013   neg  . BREAST BIOPSY Left 01/14/2013   neg  . BREAST BIOPSY Left 2017   NEG  . COLONOSCOPY  2013   Dr Frederick Peers polyps, FH of colon cancer   . TONSILLECTOMY  1966    Family History  Problem Relation Age of Onset  . Colon cancer Paternal Uncle 50       had 3 with colon ca  . Colon cancer Paternal Aunt 35  . Lung cancer Maternal Aunt 60  . Colon cancer Maternal Aunt 60  . Breast cancer Neg Hx     Social History Social History   Tobacco Use  . Smoking status: Never Smoker  . Smokeless tobacco: Never Used  Substance Use Topics  . Alcohol use: Yes  . Drug use: No    Allergies  Allergen Reactions  . Cephalosporins Hives  . Sulfa Antibiotics Other (See Comments)    dizziness    Current Outpatient Medications  Medication Sig Dispense Refill  . cholecalciferol (VITAMIN D) 1000 units tablet Take 1,000 Units by mouth daily.    Marland Kitchen FLUoxetine (PROZAC) 10 MG capsule Take 10 mg by mouth daily.    Marland Kitchen GLUCOSAMINE HCL PO Take by mouth.    Marland Kitchen ibuprofen (ADVIL,MOTRIN) 200 MG tablet Take 400 mg by mouth every 6 (six) hours as needed for pain.    Marland Kitchen levothyroxine (SYNTHROID, LEVOTHROID) 137 MCG tablet Take 137 mcg by mouth daily before breakfast.    . Multiple Vitamins-Minerals (MULTIVITAMIN WITH MINERALS) tablet Take 1 tablet by mouth daily.    Marland Kitchen triamcinolone ointment (KENALOG) 0.1 %     .  triamterene-hydrochlorothiazide (MAXZIDE-25) 37.5-25 MG per tablet Take 1 tablet by mouth daily.     No current facility-administered medications for this visit.     Review of Systems Review of Systems  Constitutional: Negative.   Respiratory: Negative.   Cardiovascular: Negative.     Blood pressure 128/74, pulse 72, resp. rate 12, height 5\' 5"  (1.651 m), weight 201 lb (91.2 kg).  Physical Exam Physical Exam  Constitutional: She is oriented to person, place, and time. She appears well-developed and well-nourished.  Eyes: Conjunctivae are normal. No scleral icterus.  Neck: Neck supple.  Cardiovascular: Normal rate, regular rhythm and normal heart sounds.  Pulmonary/Chest: Effort normal and breath sounds normal. Right breast exhibits no inverted nipple, no mass, no nipple discharge, no skin change and no tenderness. Left breast exhibits no inverted nipple, no mass, no nipple discharge, no skin change and no tenderness.  Lymphadenopathy:    She has no cervical adenopathy.    She has no axillary adenopathy.  Neurological: She is alert and oriented to person, place, and time.  Skin: Skin is warm and dry.    Data Reviewed August 29, 2017 screening mammogram is reviewed.  New density noted on the single  view of the right breast.  No discernible abnormality on the MLO view.  BI-RADS-0.  New from prior exams.  Ultrasound examination of the right breast in the upper outer quadrant showed multiple small cystic lesions the largest measuring only 0.3 by 0.5 cm in diameter.  No increased vascular flow.  This may or may not correspond to mammographic abnormality.  BI-RADS-3  Assessment    New density on cc view of the right breast.    Plan  Patient to have added views done. Call after mammogram done.  The patient is aware to call back for any questions or concerns.  HPI, Physical Exam, Assessment and Plan have been scribed under the direction and in the presence of Donnalee CurryJeffrey Andriea Hasegawa,  MD.  Ples SpecterJessica Qualls, CMA  I have completed the exam and reviewed the above documentation for accuracy and completeness.  I agree with the above.  [employer] has been used and any errors in dictation or transcription are unintentional.  Donnalee CurryJeffrey Yehudis Monceaux, M.D., F.A.C.S.  Merrily PewJeffrey W Tillie Viverette 09/04/2017, 8:22 PM

## 2017-09-03 NOTE — Patient Instructions (Addendum)
Patient to have added views done.Call after mammogram is done  The patient is aware to call back for any questions or concern

## 2017-09-04 DIAGNOSIS — R928 Other abnormal and inconclusive findings on diagnostic imaging of breast: Secondary | ICD-10-CM | POA: Insufficient documentation

## 2017-09-10 ENCOUNTER — Ambulatory Visit
Admission: RE | Admit: 2017-09-10 | Discharge: 2017-09-10 | Disposition: A | Discharge status: Home / Self Care | Payer: 59 | Source: Ambulatory Visit | Attending: General Surgery | Admitting: General Surgery

## 2017-09-10 DIAGNOSIS — N6489 Other specified disorders of breast: Secondary | ICD-10-CM

## 2017-09-10 DIAGNOSIS — R928 Other abnormal and inconclusive findings on diagnostic imaging of breast: Secondary | ICD-10-CM

## 2019-05-11 ENCOUNTER — Other Ambulatory Visit: Payer: Self-pay | Admitting: Family Medicine

## 2019-05-11 DIAGNOSIS — Z1231 Encounter for screening mammogram for malignant neoplasm of breast: Secondary | ICD-10-CM

## 2019-08-05 ENCOUNTER — Ambulatory Visit
Admission: RE | Admit: 2019-08-05 | Discharge: 2019-08-05 | Disposition: A | Discharge status: Home / Self Care | Payer: 59 | Source: Ambulatory Visit | Attending: Family Medicine | Admitting: Family Medicine

## 2019-08-05 DIAGNOSIS — Z1231 Encounter for screening mammogram for malignant neoplasm of breast: Secondary | ICD-10-CM | POA: Insufficient documentation

## 2019-08-11 ENCOUNTER — Other Ambulatory Visit: Payer: Self-pay | Admitting: Family Medicine

## 2019-08-11 DIAGNOSIS — N6489 Other specified disorders of breast: Secondary | ICD-10-CM

## 2019-08-11 DIAGNOSIS — R928 Other abnormal and inconclusive findings on diagnostic imaging of breast: Secondary | ICD-10-CM

## 2019-08-13 ENCOUNTER — Ambulatory Visit
Admission: RE | Admit: 2019-08-13 | Discharge: 2019-08-13 | Disposition: A | Discharge status: Home / Self Care | Payer: 59 | Source: Ambulatory Visit | Attending: Family Medicine | Admitting: Family Medicine

## 2019-08-13 DIAGNOSIS — R928 Other abnormal and inconclusive findings on diagnostic imaging of breast: Secondary | ICD-10-CM

## 2019-08-13 DIAGNOSIS — N6489 Other specified disorders of breast: Secondary | ICD-10-CM

## 2019-09-24 ENCOUNTER — Ambulatory Visit: Payer: 59 | Attending: Internal Medicine

## 2019-09-24 ENCOUNTER — Ambulatory Visit: Payer: 59

## 2019-09-24 DIAGNOSIS — Z23 Encounter for immunization: Secondary | ICD-10-CM

## 2019-09-24 NOTE — Progress Notes (Signed)
   Covid-19 Vaccination Clinic  Name:  [name]    MRN: [id number] DOB: 16-Sep-1960  09/24/2019  [name] was observed post Covid-19 immunization for 15 minutes without incident. She was provided with Vaccine Information Sheet and instruction to access the V-Safe system.   [name] was instructed to call 911 with any severe reactions post vaccine: Marland Kitchen Difficulty breathing  . Swelling of face and throat  . A fast heartbeat  . A bad rash all over body  . Dizziness and weakness   Immunizations Administered    Name Date Dose VIS Date Route   Pfizer COVID-19 Vaccine 09/24/2019 10:03 AM 0.3 mL 06/26/2019 Intramuscular   Manufacturer: [employer], [employer]   Lot: [id number]   NDC: [address]-4

## 2019-10-20 ENCOUNTER — Ambulatory Visit: Payer: 59 | Attending: Internal Medicine

## 2019-10-20 ENCOUNTER — Other Ambulatory Visit: Payer: Self-pay

## 2019-10-20 DIAGNOSIS — Z23 Encounter for immunization: Secondary | ICD-10-CM

## 2019-10-20 NOTE — Progress Notes (Signed)
   Covid-19 Vaccination Clinic  Name:  [name]    MRN: [id number] DOB: [birth year]  10/20/2019  [name] was observed post Covid-19 immunization for 15 minutes without incident. She was provided with Vaccine Information Sheet and instruction to access the V-Safe system.   [name] was instructed to call 911 with any severe reactions post vaccine: Marland Kitchen Difficulty breathing  . Swelling of face and throat  . A fast heartbeat  . A bad rash all over body  . Dizziness and weakness   Immunizations Administered    Name Date Dose VIS Date Route   Pfizer COVID-19 Vaccine 10/20/2019 10:09 AM 0.3 mL 06/26/2019 Intramuscular   Manufacturer: [employer], [employer]   Lot: [id number]   NDC: [address]-9

## 2020-10-13 IMAGING — MG MM DIGITAL DIAGNOSTIC UNILAT*R* W/ TOMO W/ CAD
4 series · 4 of 12 positions shown · non-contrast
Comparison: Previous exam(s).

CLINICAL DATA: The patient was called back for a right breast mass.

EXAM:
DIGITAL DIAGNOSTIC RIGHT MAMMOGRAM
ULTRASOUND RIGHT BREAST

[R ML synth-2D]
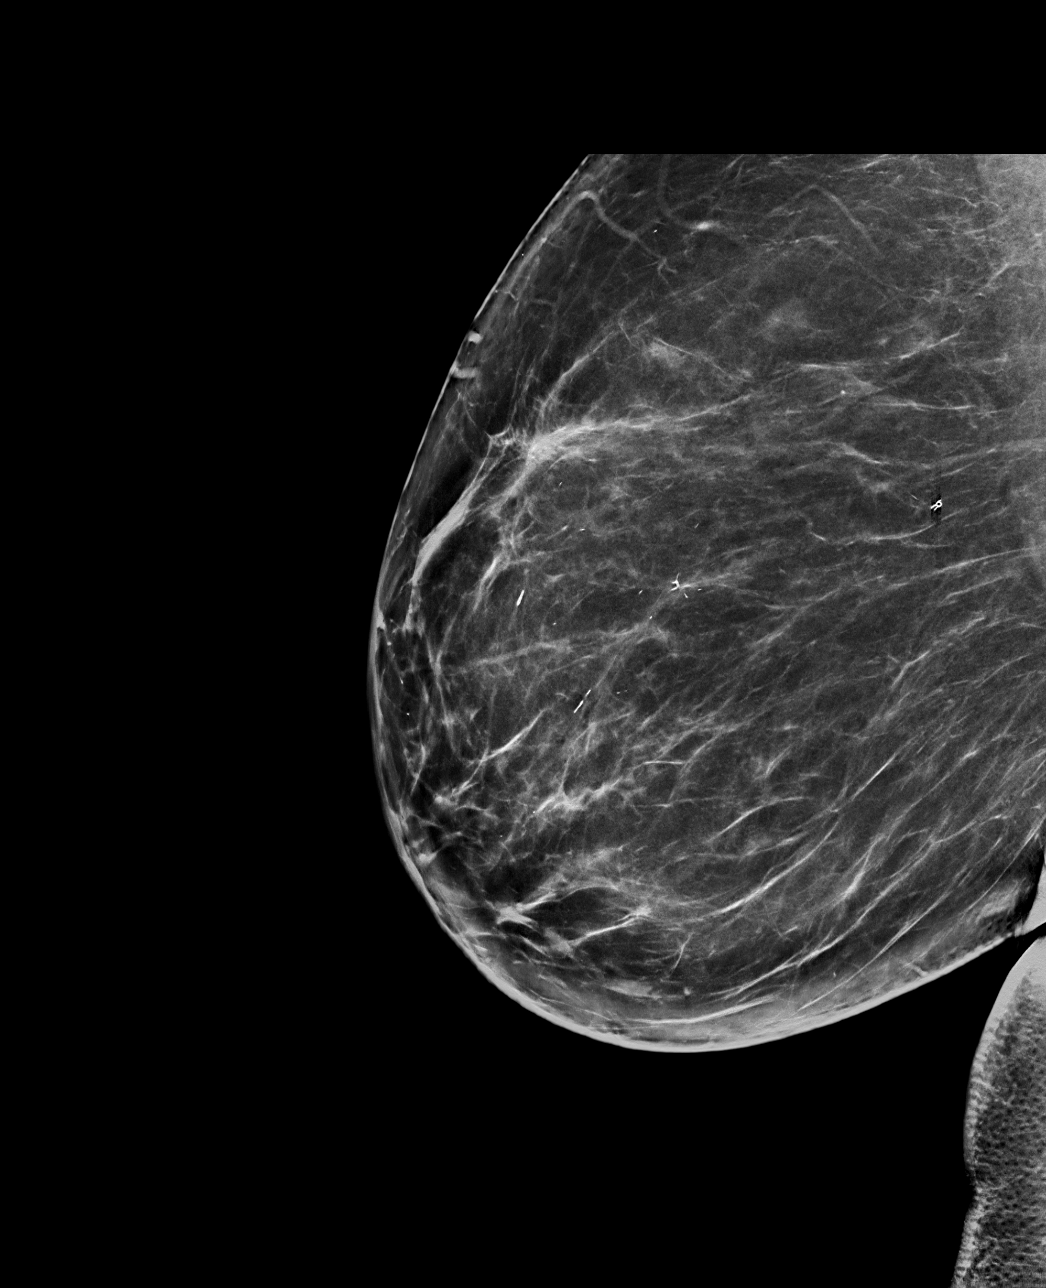

[R CC synth-2D]
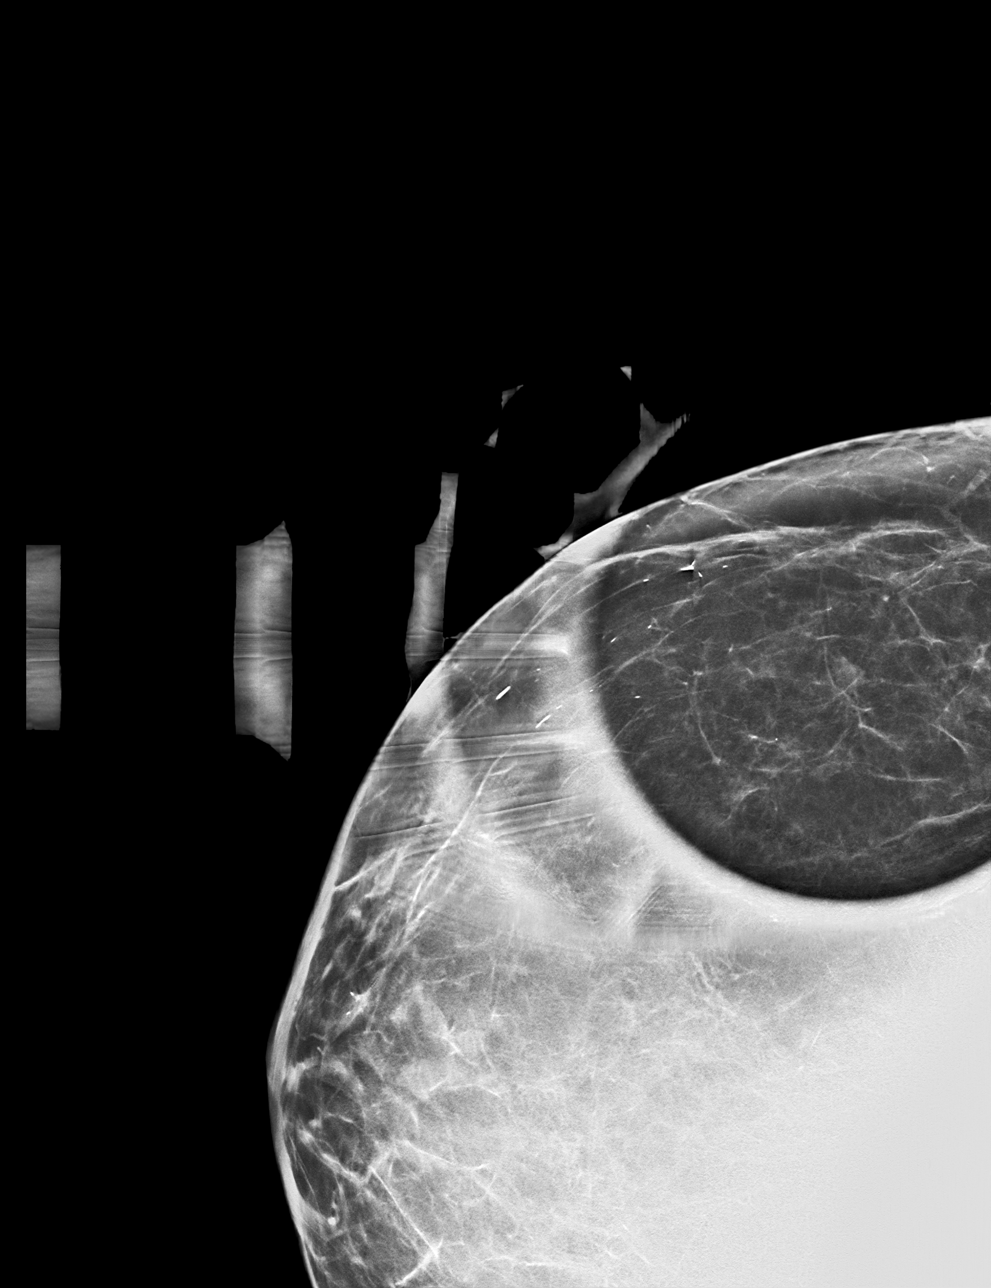

[R ML tomo · tomo slice 43/85.0]
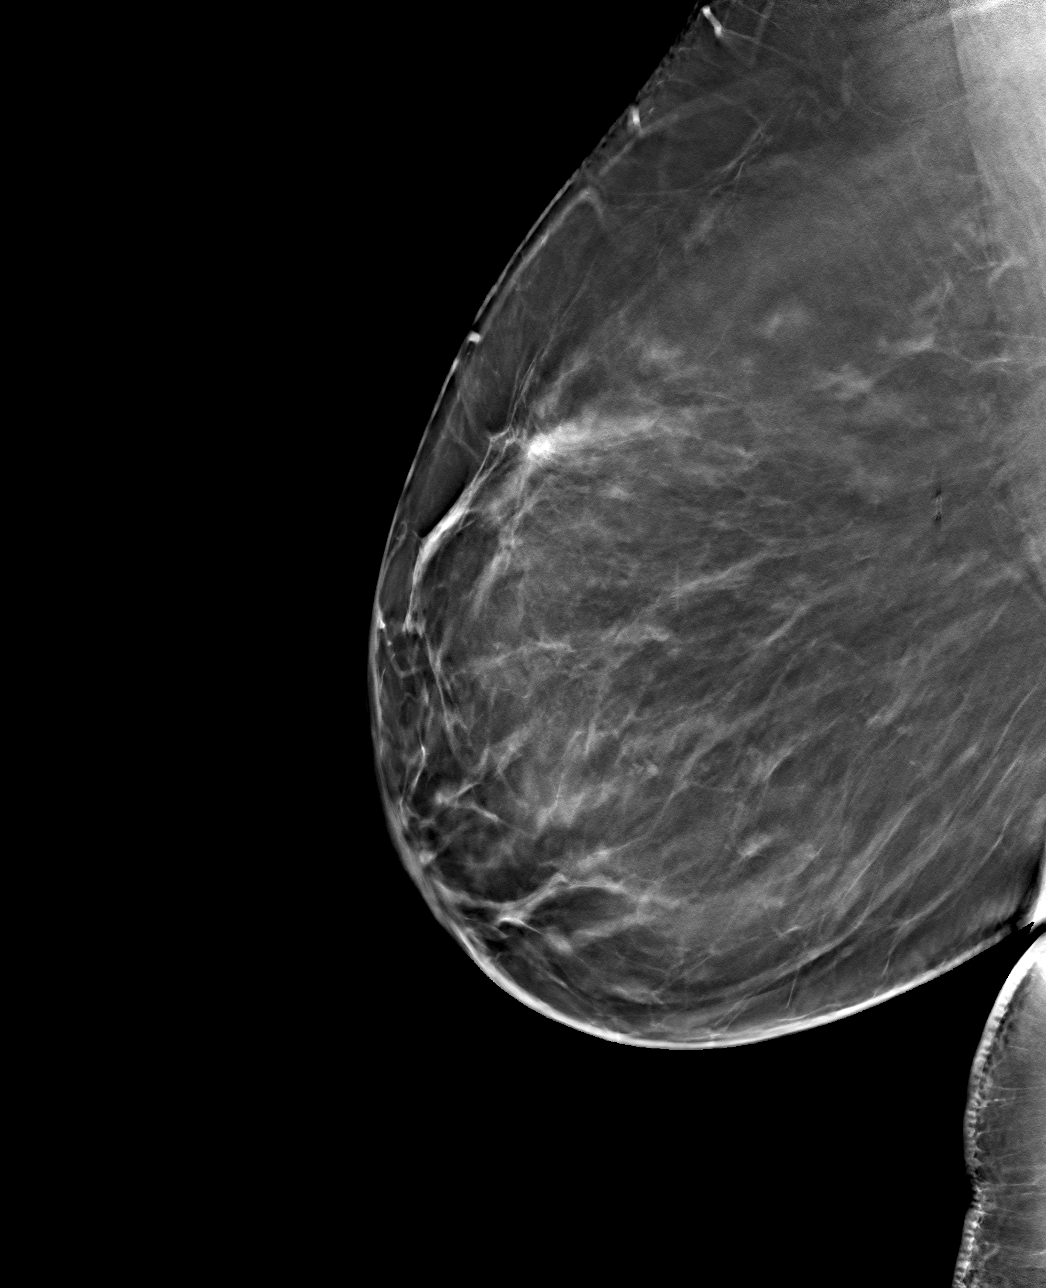

[R CC tomo · tomo slice 32/63.0]
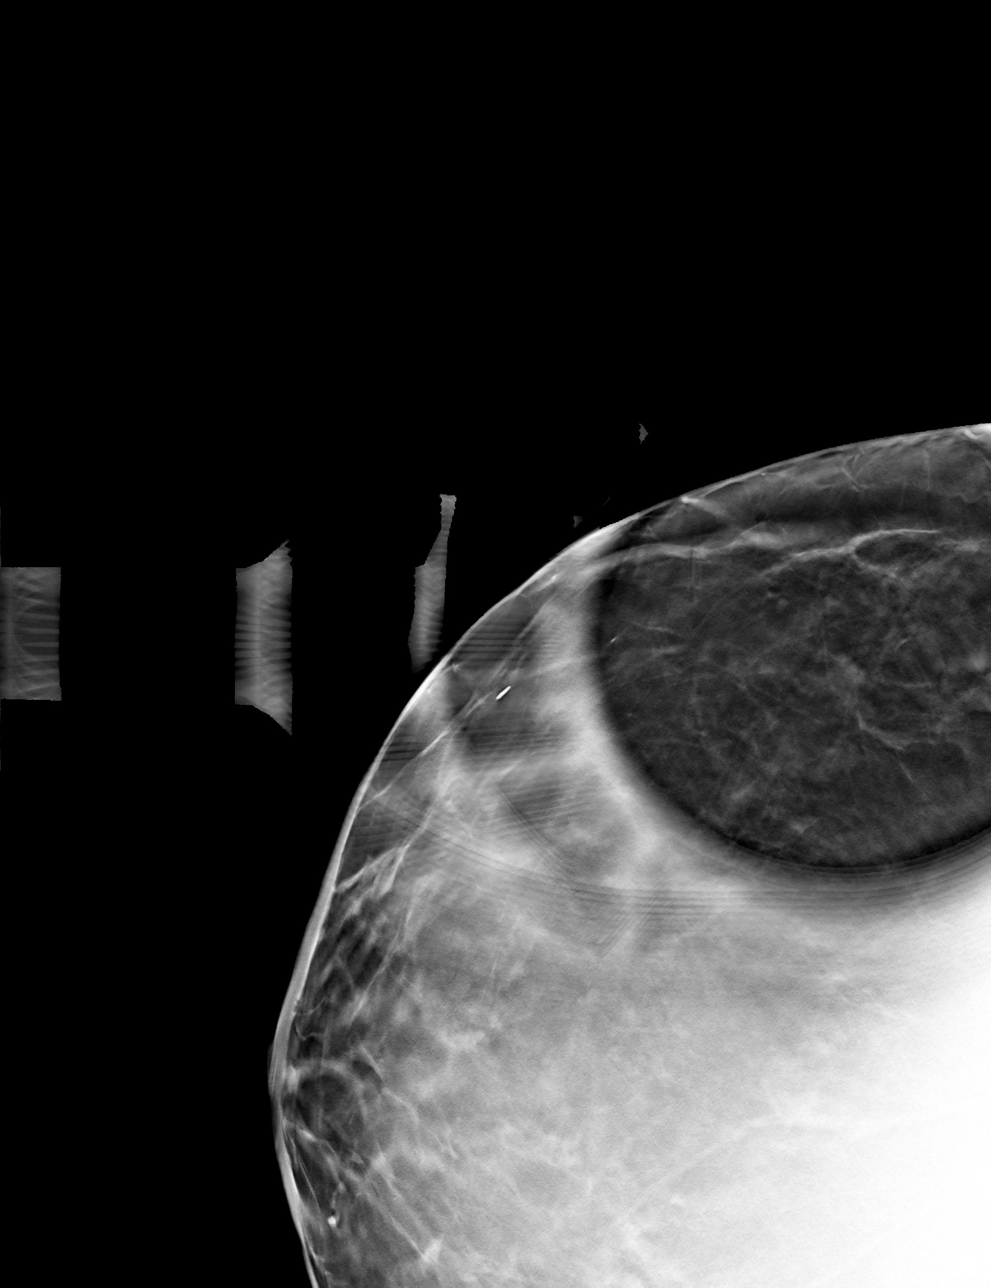

[4 of 12 positions shown; findings below may reference images not displayed]

ACR Breast Density Category b: There are scattered areas of
fibroglandular density.
FINDINGS: The mass in the superolateral right breast persists on additional
imaging.

On physical exam, no suspicious lumps are identified.

Targeted ultrasound is performed, showing multiple clusters of cysts
in the right breast. The largest is seen at 11 o'clock, 8 cm from
the nipple measuring up to 7 mm. The sonographic findings explain
the mammographic findings and are of no concern.
IMPRESSION: No mammographic or sonographic evidence of malignancy. Recommend
annual screening mammography.

RECOMMENDATION:
Recommend annual screening mammography.

I have discussed the findings and recommendations with the patient.
If applicable, a reminder letter will be sent to the patient
regarding the next appointment.

BI-RADS CATEGORY  2: Benign.

## 2021-01-23 ENCOUNTER — Other Ambulatory Visit: Payer: Self-pay | Admitting: Family Medicine

## 2021-01-23 DIAGNOSIS — Z1231 Encounter for screening mammogram for malignant neoplasm of breast: Secondary | ICD-10-CM

## 2021-01-31 ENCOUNTER — Other Ambulatory Visit: Payer: Self-pay

## 2021-01-31 ENCOUNTER — Ambulatory Visit
Admission: RE | Admit: 2021-01-31 | Discharge: 2021-01-31 | Disposition: A | Discharge status: Home / Self Care | Payer: 59 | Source: Ambulatory Visit | Attending: Family Medicine | Admitting: Family Medicine

## 2021-01-31 DIAGNOSIS — Z1231 Encounter for screening mammogram for malignant neoplasm of breast: Secondary | ICD-10-CM | POA: Insufficient documentation

## 2021-02-07 ENCOUNTER — Other Ambulatory Visit: Payer: Self-pay | Admitting: Family Medicine

## 2021-02-07 DIAGNOSIS — R928 Other abnormal and inconclusive findings on diagnostic imaging of breast: Secondary | ICD-10-CM

## 2021-02-07 DIAGNOSIS — N6320 Unspecified lump in the left breast, unspecified quadrant: Secondary | ICD-10-CM

## 2021-02-08 ENCOUNTER — Ambulatory Visit
Admission: RE | Admit: 2021-02-08 | Discharge: 2021-02-08 | Disposition: A | Discharge status: Home / Self Care | Payer: 59 | Source: Ambulatory Visit | Attending: Family Medicine | Admitting: Family Medicine

## 2021-02-08 ENCOUNTER — Other Ambulatory Visit: Payer: Self-pay

## 2021-02-08 DIAGNOSIS — R928 Other abnormal and inconclusive findings on diagnostic imaging of breast: Secondary | ICD-10-CM

## 2021-02-08 DIAGNOSIS — N6320 Unspecified lump in the left breast, unspecified quadrant: Secondary | ICD-10-CM

## 2021-12-18 ENCOUNTER — Other Ambulatory Visit: Payer: Self-pay | Admitting: Family Medicine

## 2021-12-18 DIAGNOSIS — Z1231 Encounter for screening mammogram for malignant neoplasm of breast: Secondary | ICD-10-CM

## 2022-02-08 ENCOUNTER — Ambulatory Visit
Admission: RE | Admit: 2022-02-08 | Discharge: 2022-02-08 | Disposition: A | Discharge status: Home / Self Care | Payer: 59 | Source: Ambulatory Visit | Attending: Family Medicine | Admitting: Family Medicine

## 2022-02-08 DIAGNOSIS — Z1231 Encounter for screening mammogram for malignant neoplasm of breast: Secondary | ICD-10-CM | POA: Insufficient documentation

## 2023-01-25 ENCOUNTER — Other Ambulatory Visit: Payer: Self-pay | Admitting: Family Medicine

## 2023-01-25 DIAGNOSIS — Z1231 Encounter for screening mammogram for malignant neoplasm of breast: Secondary | ICD-10-CM

## 2023-02-20 ENCOUNTER — Ambulatory Visit
Admission: RE | Admit: 2023-02-20 | Discharge: 2023-02-20 | Disposition: A | Discharge status: Home / Self Care | Payer: 59 | Source: Ambulatory Visit | Attending: Family Medicine | Admitting: Family Medicine

## 2023-02-20 DIAGNOSIS — Z1231 Encounter for screening mammogram for malignant neoplasm of breast: Secondary | ICD-10-CM | POA: Insufficient documentation

## 2023-05-23 ENCOUNTER — Other Ambulatory Visit: Payer: Self-pay | Admitting: Orthopedic Surgery

## 2023-06-04 ENCOUNTER — Encounter
Admission: RE | Admit: 2023-06-04 | Discharge: 2023-06-04 | Disposition: A | Discharge status: Home / Self Care | Payer: 59 | Source: Ambulatory Visit | Attending: Orthopedic Surgery | Admitting: Orthopedic Surgery

## 2023-06-04 VITALS — BP 131/59 | HR 80 | Resp 14 | Ht 63.0 in | Wt 220.9 lb

## 2023-06-04 DIAGNOSIS — Z01818 Encounter for other preprocedural examination: Secondary | ICD-10-CM | POA: Diagnosis present

## 2023-06-04 DIAGNOSIS — Z01810 Encounter for preprocedural cardiovascular examination: Secondary | ICD-10-CM | POA: Diagnosis not present

## 2023-06-04 HISTORY — DX: Hypothyroidism, unspecified: E03.9

## 2023-06-04 HISTORY — DX: Depression, unspecified: F32.A

## 2023-06-04 HISTORY — DX: Sleep apnea, unspecified: G47.30

## 2023-06-04 HISTORY — DX: Personal history of other medical treatment: Z92.89

## 2023-06-04 HISTORY — DX: Anxiety disorder, unspecified: F41.9

## 2023-06-04 HISTORY — DX: Essential (primary) hypertension: I10

## 2023-06-04 HISTORY — DX: Prediabetes: R73.03

## 2023-06-04 HISTORY — DX: Gastro-esophageal reflux disease without esophagitis: K21.9

## 2023-06-04 LAB — URINALYSIS, ROUTINE W REFLEX MICROSCOPIC
Bacteria, UA: NONE SEEN
Bilirubin Urine: NEGATIVE
Glucose, UA: NEGATIVE mg/dL
Hgb urine dipstick: NEGATIVE
Ketones, ur: NEGATIVE mg/dL
Leukocytes,Ua: NEGATIVE
Nitrite: NEGATIVE
Protein, ur: NEGATIVE mg/dL
RBC / HPF: 0 RBC/hpf (ref 0–5)
Specific Gravity, Urine: 1.011 (ref 1.005–1.030)
Squamous Epithelial / HPF: 0 /[HPF] (ref 0–5)
pH: 8 (ref 5.0–8.0)

## 2023-06-04 LAB — COMPREHENSIVE METABOLIC PANEL
ALT: 18 U/L (ref 0–44)
AST: 18 U/L (ref 15–41)
Albumin: 3.9 g/dL (ref 3.5–5.0)
Alkaline Phosphatase: 59 U/L (ref 38–126)
Anion gap: 8 (ref 5–15)
BUN: 13 mg/dL (ref 8–23)
CO2: 28 mmol/L (ref 22–32)
Calcium: 8.7 mg/dL — ABNORMAL LOW (ref 8.9–10.3)
Chloride: 92 mmol/L — ABNORMAL LOW (ref 98–111)
Creatinine, Ser: 0.7 mg/dL (ref 0.44–1.00)
GFR, Estimated: >60 mL/min (ref >60)
Glucose, Bld: 92 mg/dL (ref 70–99)
Potassium: 3.3 mmol/L — ABNORMAL LOW (ref 3.5–5.1)
Sodium: 128 mmol/L — ABNORMAL LOW (ref 135–145)
Total Bilirubin: 0.5 mg/dL (ref <1.2)
Total Protein: 7.1 g/dL (ref 6.5–8.1)

## 2023-06-04 LAB — SURGICAL PCR SCREEN
MRSA, PCR: NEGATIVE
Staphylococcus aureus: NEGATIVE

## 2023-06-04 NOTE — Patient Instructions (Addendum)
Your procedure is scheduled on:06-17-23 Monday Report to the Registration Desk on the 1st floor of the [facility].Then proceed to the 2nd floor Surgery Desk To find out your arrival time, please call [phone] between 1PM - 3PM on:06-14-23 Friday If your arrival time is 6:00 am, do not arrive before that time as the [facility] entrance doors do not open until 6:00 am.  REMEMBER: Instructions that are not followed completely may result in serious medical risk, up to and including death; or upon the discretion of your surgeon and anesthesiologist your surgery may need to be rescheduled.  Do not eat food after midnight the night before surgery.  No gum chewing or hard candies.  You may however, drink CLEAR liquids up to 2 hours before you are scheduled to arrive for your surgery. Do not drink anything within 2 hours of your scheduled arrival time.  Clear liquids include: - water  - apple juice without pulp - gatorade (not RED colors) - black coffee or tea (Do NOT add milk or creamers to the coffee or tea) Do NOT drink anything that is not on this list.  In addition, your doctor has ordered for you to drink the provided:  Ensure Pre-Surgery Clear Carbohydrate Drink  Drinking this carbohydrate drink up to two hours before surgery helps to reduce insulin resistance and improve patient outcomes. Please complete drinking 2 hours before scheduled arrival time.  One week prior to surgery:Starting 1124-24 Stop Anti-inflammatories (NSAIDS) such as Advil, Aleve, Ibuprofen, Motrin, Naproxen, Naprosyn and Aspirin based products such as Excedrin, Goody's Powder, BC Powder. Stop ANY OVER THE COUNTER supplements until after surgery (Vitamin C, Calcium + D3, Vitamin D, Glucosamine, Multivitamin)  You may however, continue to take Tylenol if needed for pain up until the day of surgery.  Stop meloxicam (MOBIC) 7 days prior to surgery-Last dose will be on 06-09-23 Sunday  Continue taking all of  your other prescription medications up until the day of surgery.  ON THE DAY OF SURGERY ONLY TAKE THESE MEDICATIONS WITH SIPS OF WATER: -buPROPion (WELLBUTRIN XL)  -levothyroxine (SYNTHROID, LEVOTHROID)  -pantoprazole (PROTONIX)   No Alcohol for 24 hours before or after surgery.  No Smoking including e-cigarettes for 24 hours before surgery.  No chewable tobacco products for at least 6 hours before surgery.  No nicotine patches on the day of surgery.  Do not use any "recreational" drugs for at least a week (preferably 2 weeks) before your surgery.  Please be advised that the combination of cocaine and anesthesia may have negative outcomes, up to and including death. If you test positive for cocaine, your surgery will be cancelled.  On the morning of surgery brush your teeth with toothpaste and water, you may rinse your mouth with mouthwash if you wish. Do not swallow any toothpaste or mouthwash.  Use CHG Soap as directed on instruction sheet.  Do not wear jewelry, make-up, hairpins, clips or nail polish.  For welded (permanent) jewelry: bracelets, anklets, waist bands, etc.  Please have this removed prior to surgery.  If it is not removed, there is a chance that hospital personnel will need to cut it off on the day of surgery.  Do not wear lotions, powders, or perfumes.   Do not shave body hair from the neck down 48 hours before surgery.  Contact lenses, hearing aids and dentures may not be worn into surgery.  Do not bring valuables to the hospital. [facility] is not responsible for any missing/lost belongings or  valuables.   Notify your doctor if there is any change in your medical condition (cold, fever, infection).  Wear comfortable clothing (specific to your surgery type) to the hospital.  After surgery, you can help prevent lung complications by doing breathing exercises.  Take deep breaths and cough every 1-2 hours. Your doctor may order a device called an Incentive  Spirometer to help you take deep breaths. When coughing or sneezing, hold a pillow firmly against your incision with both hands. This is called "splinting." Doing this helps protect your incision. It also decreases belly discomfort.  If you are being admitted to the hospital overnight, leave your suitcase in the car. After surgery it may be brought to your room.  In case of increased patient census, it may be necessary for you, the patient, to continue your postoperative care in the Same Day Surgery department.  If you are being discharged the day of surgery, you will not be allowed to drive home. You will need a responsible individual to drive you home and stay with you for 24 hours after surgery.   If you are taking public transportation, you will need to have a responsible individual with you.  Please call the Pre-admissions Testing Dept. at [phone] if you have any questions about these instructions.  Surgery Visitation Policy:  Patients having surgery or a procedure may have two visitors.  Children under the age of 45 must have an adult with them who is not the patient.  Inpatient Visitation:    Visiting hours are 7 a.m. to 8 p.m. Up to four visitors are allowed at one time in a patient room. The visitors may rotate out with other people during the day.  One visitor age 63 or older may stay with the patient overnight and must be in the room by 8 p.m.    Pre-operative 5 CHG Bath Instructions   You can play a key role in reducing the risk of infection after surgery. Your skin needs to be as free of germs as possible. You can reduce the number of germs on your skin by washing with CHG (chlorhexidine gluconate) soap before surgery. CHG is an antiseptic soap that kills germs and continues to kill germs even after washing.   DO NOT use if you have an allergy to chlorhexidine/CHG or antibacterial soaps. If your skin becomes reddened or irritated, stop using the CHG and notify one  of our RNs at [phone].   Please shower with the CHG soap starting 4 days before surgery using the following schedule:     Please keep in mind the following:  DO NOT shave, including legs and underarms, starting the day of your first shower.   You may shave your face at any point before/day of surgery.  Place clean sheets on your bed the day you start using CHG soap. Use a clean washcloth (not used since being washed) for each shower. DO NOT sleep with pets once you start using the CHG.   CHG Shower Instructions:  If you choose to wash your hair and private area, wash first with your normal shampoo/soap.  After you use shampoo/soap, rinse your hair and body thoroughly to remove shampoo/soap residue.  Turn the water OFF and apply about 3 tablespoons (45 ml) of CHG soap to a CLEAN washcloth.  Apply CHG soap ONLY FROM YOUR NECK DOWN TO YOUR TOES (washing for 3-5 minutes)  DO NOT use CHG soap on face, private areas, open wounds, or sores.  Pay  special attention to the area where your surgery is being performed.  If you are having back surgery, having someone wash your back for you may be helpful. Wait 2 minutes after CHG soap is applied, then you may rinse off the CHG soap.  Pat dry with a clean towel  Put on clean clothes/pajamas   If you choose to wear lotion, please use ONLY the CHG-compatible lotions on the back of this paper.     Additional instructions for the day of surgery: DO NOT APPLY any lotions, deodorants, cologne, or perfumes.   Put on clean/comfortable clothes.  Brush your teeth.  Ask your nurse before applying any prescription medications to the skin.      CHG Compatible Lotions   Aveeno Moisturizing lotion  Cetaphil Moisturizing Cream  Cetaphil Moisturizing Lotion  Clairol Herbal Essence Moisturizing Lotion, Dry Skin  Clairol Herbal Essence Moisturizing Lotion, Extra Dry Skin  Clairol Herbal Essence Moisturizing Lotion, Normal Skin  Curel Age Defying  Therapeutic Moisturizing Lotion with Alpha Hydroxy  Curel Extreme Care Body Lotion  Curel Soothing Hands Moisturizing Hand Lotion  Curel Therapeutic Moisturizing Cream, Fragrance-Free  Curel Therapeutic Moisturizing Lotion, Fragrance-Free  Curel Therapeutic Moisturizing Lotion, Original Formula  Eucerin Daily Replenishing Lotion  Eucerin Dry Skin Therapy Plus Alpha Hydroxy Crme  Eucerin Dry Skin Therapy Plus Alpha Hydroxy Lotion  Eucerin Original Crme  Eucerin Original Lotion  Eucerin Plus Crme Eucerin Plus Lotion  Eucerin TriLipid Replenishing Lotion  Keri Anti-Bacterial Hand Lotion  Keri Deep Conditioning Original Lotion Dry Skin Formula Softly Scented  Keri Deep Conditioning Original Lotion, Fragrance Free Sensitive Skin Formula  Keri Lotion Fast Absorbing Fragrance Free Sensitive Skin Formula  Keri Lotion Fast Absorbing Softly Scented Dry Skin Formula  Keri Original Lotion  Keri Skin Renewal Lotion Keri Silky Smooth Lotion  Keri Silky Smooth Sensitive Skin Lotion  Nivea Body Creamy Conditioning Oil  Nivea Body Extra Enriched Teacher, adult education Moisturizing Lotion Nivea Crme  Nivea Skin Firming Lotion  NutraDerm 30 Skin Lotion  NutraDerm Skin Lotion  NutraDerm Therapeutic Skin Cream  NutraDerm Therapeutic Skin Lotion  ProShield Protective Hand Cream  Provon moisturizing lotion

## 2023-06-17 ENCOUNTER — Ambulatory Visit: Payer: 59 | Admitting: Urgent Care

## 2023-06-17 ENCOUNTER — Encounter
Admission: RE | Disposition: A | Discharge status: Home Health | Payer: Self-pay | Source: Ambulatory Visit | Attending: Orthopedic Surgery

## 2023-06-17 ENCOUNTER — Other Ambulatory Visit: Payer: Self-pay

## 2023-06-17 ENCOUNTER — Ambulatory Visit: Payer: 59

## 2023-06-17 ENCOUNTER — Encounter: Payer: Self-pay | Admitting: Orthopedic Surgery

## 2023-06-17 ENCOUNTER — Observation Stay
Admission: RE | Admit: 2023-06-17 | Discharge: 2023-06-18 | Disposition: A | Discharge status: Home Health | Payer: 59 | Source: Ambulatory Visit | Attending: Orthopedic Surgery | Admitting: Orthopedic Surgery

## 2023-06-17 DIAGNOSIS — E039 Hypothyroidism, unspecified: Secondary | ICD-10-CM | POA: Diagnosis not present

## 2023-06-17 DIAGNOSIS — I10 Essential (primary) hypertension: Secondary | ICD-10-CM | POA: Insufficient documentation

## 2023-06-17 DIAGNOSIS — M1712 Unilateral primary osteoarthritis, left knee: Secondary | ICD-10-CM | POA: Diagnosis present

## 2023-06-17 DIAGNOSIS — Z79899 Other long term (current) drug therapy: Secondary | ICD-10-CM | POA: Insufficient documentation

## 2023-06-17 DIAGNOSIS — Z96652 Presence of left artificial knee joint: Principal | ICD-10-CM

## 2023-06-17 HISTORY — PX: TOTAL KNEE ARTHROPLASTY: SHX125

## 2023-06-17 SURGERY — ARTHROPLASTY, KNEE, TOTAL
Anesthesia: Spinal | Site: Knee | Laterality: Left

## 2023-06-17 MED ORDER — SODIUM CHLORIDE (PF) 0.9 % IJ SOLN
INTRAMUSCULAR | Status: DC | PRN
  Administered 2023-06-17: 71 mL

## 2023-06-17 MED ORDER — PROPOFOL 10 MG/ML IV BOLUS
INTRAVENOUS | Status: AC
  Filled 2023-06-17: qty 20

## 2023-06-17 MED ORDER — FENTANYL CITRATE (PF) 100 MCG/2ML IJ SOLN
INTRAMUSCULAR | Status: AC
  Filled 2023-06-17: qty 2

## 2023-06-17 MED ORDER — VANCOMYCIN HCL IN DEXTROSE 1-5 GM/200ML-% IV SOLN
INTRAVENOUS | Status: AC
  Filled 2023-06-17: qty 200

## 2023-06-17 MED ORDER — CHLORHEXIDINE GLUCONATE 0.12 % MT SOLN
OROMUCOSAL | Status: AC
  Filled 2023-06-17: qty 15

## 2023-06-17 MED ORDER — TRANEXAMIC ACID-NACL 1000-0.7 MG/100ML-% IV SOLN
INTRAVENOUS | Status: AC
  Filled 2023-06-17: qty 100

## 2023-06-17 MED ORDER — OXYCODONE HCL 5 MG PO TABS
ORAL_TABLET | ORAL | Status: AC
  Filled 2023-06-17: qty 1

## 2023-06-17 MED ORDER — SODIUM CHLORIDE 0.9 % IR SOLN
Status: DC | PRN
  Administered 2023-06-17: 3000 mL

## 2023-06-17 MED ORDER — CEFAZOLIN SODIUM-DEXTROSE 2-4 GM/100ML-% IV SOLN
INTRAVENOUS | Status: AC
  Filled 2023-06-17: qty 100

## 2023-06-17 MED ORDER — LACTATED RINGERS IV SOLN: INTRAVENOUS | Status: DC

## 2023-06-17 MED ORDER — MORPHINE SULFATE (PF) 4 MG/ML IV SOLN
0.5000 mg | INTRAVENOUS | Status: DC | PRN
Start: 2023-06-17 — End: 2023-06-18
  Administered 2023-06-17: 1 mg via INTRAVENOUS

## 2023-06-17 MED ORDER — CHLORHEXIDINE GLUCONATE 0.12 % MT SOLN
15.0000 mL | Freq: Once | OROMUCOSAL | Status: AC
  Administered 2023-06-17: 15 mL via OROMUCOSAL

## 2023-06-17 MED ORDER — SURGIPHOR WOUND IRRIGATION SYSTEM - OPTIME
TOPICAL | Status: DC | PRN
  Administered 2023-06-17: 450 mL via TOPICAL

## 2023-06-17 MED ORDER — LEVOTHYROXINE SODIUM 137 MCG PO TABS
137.0000 ug | ORAL_TABLET | Freq: Every day | ORAL | Status: DC
  Administered 2023-06-18: 137 ug via ORAL
  Filled 2023-06-17: qty 1

## 2023-06-17 MED ORDER — LIDOCAINE HCL (CARDIAC) PF 100 MG/5ML IV SOSY
PREFILLED_SYRINGE | INTRAVENOUS | Status: DC | PRN
  Administered 2023-06-17: 30 mg via INTRAVENOUS

## 2023-06-17 MED ORDER — ONDANSETRON HCL 4 MG/2ML IJ SOLN: 4.0000 mg | Freq: Once | INTRAMUSCULAR | Status: DC | PRN

## 2023-06-17 MED ORDER — KETOROLAC TROMETHAMINE 15 MG/ML IJ SOLN
INTRAMUSCULAR | Status: AC
  Filled 2023-06-17: qty 1

## 2023-06-17 MED ORDER — PANTOPRAZOLE SODIUM 40 MG PO TBEC
DELAYED_RELEASE_TABLET | ORAL | Status: AC
  Filled 2023-06-17: qty 1

## 2023-06-17 MED ORDER — METOCLOPRAMIDE HCL 5 MG/ML IJ SOLN: 5.0000 mg | Freq: Three times a day (TID) | INTRAMUSCULAR | Status: DC | PRN

## 2023-06-17 MED ORDER — TRANEXAMIC ACID-NACL 1000-0.7 MG/100ML-% IV SOLN
1000.0000 mg | INTRAVENOUS | Status: AC
Start: 2023-06-17 — End: 2023-06-17
  Administered 2023-06-17: 1000 mg via INTRAVENOUS

## 2023-06-17 MED ORDER — PROPOFOL 1000 MG/100ML IV EMUL
INTRAVENOUS | Status: AC
  Filled 2023-06-17: qty 100

## 2023-06-17 MED ORDER — VANCOMYCIN HCL IN DEXTROSE 1-5 GM/200ML-% IV SOLN: 1000.0000 mg | INTRAVENOUS | Status: DC

## 2023-06-17 MED ORDER — PHENYLEPHRINE 80 MCG/ML (10ML) SYRINGE FOR IV PUSH (FOR BLOOD PRESSURE SUPPORT)
PREFILLED_SYRINGE | INTRAVENOUS | Status: DC | PRN
  Administered 2023-06-17 (×2): 80 ug via INTRAVENOUS

## 2023-06-17 MED ORDER — CEFAZOLIN SODIUM-DEXTROSE 2-3 GM-%(50ML) IV SOLR
INTRAVENOUS | Status: DC | PRN
  Administered 2023-06-17: 2 g via INTRAVENOUS

## 2023-06-17 MED ORDER — ACETAMINOPHEN 10 MG/ML IV SOLN: 1000.0000 mg | Freq: Once | INTRAVENOUS | Status: DC | PRN

## 2023-06-17 MED ORDER — ACETAMINOPHEN 500 MG PO TABS
ORAL_TABLET | ORAL | Status: AC
  Filled 2023-06-17: qty 2

## 2023-06-17 MED ORDER — PANTOPRAZOLE SODIUM 40 MG PO TBEC
40.0000 mg | DELAYED_RELEASE_TABLET | Freq: Every day | ORAL | Status: DC
  Administered 2023-06-17 – 2023-06-18 (×2): 40 mg via ORAL

## 2023-06-17 MED ORDER — BUPROPION HCL ER (XL) 150 MG PO TB24
150.0000 mg | ORAL_TABLET | ORAL | Status: DC
  Administered 2023-06-18: 150 mg via ORAL
  Filled 2023-06-17: qty 1

## 2023-06-17 MED ORDER — DOCUSATE SODIUM 100 MG PO CAPS
ORAL_CAPSULE | ORAL | Status: AC
  Filled 2023-06-17: qty 1

## 2023-06-17 MED ORDER — LEVOFLOXACIN IN D5W 500 MG/100ML IV SOLN: 500.0000 mg | INTRAVENOUS | Status: DC

## 2023-06-17 MED ORDER — OXYCODONE HCL 5 MG PO TABS
5.0000 mg | ORAL_TABLET | Freq: Once | ORAL | Status: AC | PRN
  Administered 2023-06-17: 5 mg via ORAL

## 2023-06-17 MED ORDER — MIDAZOLAM HCL 2 MG/2ML IJ SOLN
INTRAMUSCULAR | Status: AC
Start: 2023-06-17 — End: ?
  Filled 2023-06-17: qty 2

## 2023-06-17 MED ORDER — OXYCODONE HCL 5 MG/5ML PO SOLN
5.0000 mg | Freq: Once | ORAL | Status: AC | PRN
Start: 2023-06-17 — End: 2023-06-17

## 2023-06-17 MED ORDER — TRIAMTERENE-HCTZ 37.5-25 MG PO TABS
1.0000 | ORAL_TABLET | ORAL | Status: DC
  Administered 2023-06-18: 1 via ORAL

## 2023-06-17 MED ORDER — DOCUSATE SODIUM 100 MG PO CAPS
100.0000 mg | ORAL_CAPSULE | Freq: Two times a day (BID) | ORAL | Status: DC
  Administered 2023-06-17 – 2023-06-18 (×3): 100 mg via ORAL

## 2023-06-17 MED ORDER — OXYCODONE HCL 5 MG PO TABS
5.0000 mg | ORAL_TABLET | ORAL | Status: DC | PRN
  Administered 2023-06-17 – 2023-06-18 (×5): 5 mg via ORAL

## 2023-06-17 MED ORDER — CEFAZOLIN SODIUM-DEXTROSE 2-4 GM/100ML-% IV SOLN
2.0000 g | Freq: Four times a day (QID) | INTRAVENOUS | Status: AC
  Administered 2023-06-17 (×2): 2 g via INTRAVENOUS

## 2023-06-17 MED ORDER — MONTELUKAST SODIUM 10 MG PO TABS
10.0000 mg | ORAL_TABLET | Freq: Every day | ORAL | Status: DC
  Administered 2023-06-17: 10 mg via ORAL
  Filled 2023-06-17: qty 1

## 2023-06-17 MED ORDER — FLUTICASONE PROPIONATE 50 MCG/ACT NA SUSP
2.0000 | NASAL | Status: DC
  Administered 2023-06-18: 2 via NASAL
  Filled 2023-06-17: qty 16

## 2023-06-17 MED ORDER — ONDANSETRON HCL 4 MG PO TABS: 4.0000 mg | ORAL_TABLET | Freq: Four times a day (QID) | ORAL | Status: DC | PRN

## 2023-06-17 MED ORDER — LEVOFLOXACIN IN D5W 500 MG/100ML IV SOLN
INTRAVENOUS | Status: AC
  Filled 2023-06-17: qty 100

## 2023-06-17 MED ORDER — METOCLOPRAMIDE HCL 10 MG PO TABS: 5.0000 mg | ORAL_TABLET | Freq: Three times a day (TID) | ORAL | Status: DC | PRN

## 2023-06-17 MED ORDER — BUPIVACAINE HCL (PF) 0.5 % IJ SOLN
INTRAMUSCULAR | Status: DC | PRN
  Administered 2023-06-17: 2.4 mL

## 2023-06-17 MED ORDER — ENOXAPARIN SODIUM 30 MG/0.3ML IJ SOSY
30.0000 mg | PREFILLED_SYRINGE | Freq: Two times a day (BID) | INTRAMUSCULAR | Status: DC
  Administered 2023-06-18: 30 mg via SUBCUTANEOUS

## 2023-06-17 MED ORDER — ONDANSETRON HCL 4 MG/2ML IJ SOLN
INTRAMUSCULAR | Status: AC
  Filled 2023-06-17: qty 2

## 2023-06-17 MED ORDER — SODIUM CHLORIDE 0.9 % IV SOLN: INTRAVENOUS | Status: DC

## 2023-06-17 MED ORDER — ONDANSETRON HCL 4 MG/2ML IJ SOLN
4.0000 mg | Freq: Four times a day (QID) | INTRAMUSCULAR | Status: DC | PRN
Start: 2023-06-17 — End: 2023-06-18
  Administered 2023-06-17: 4 mg via INTRAVENOUS

## 2023-06-17 MED ORDER — PROPOFOL 10 MG/ML IV BOLUS
INTRAVENOUS | Status: DC | PRN
  Administered 2023-06-17: 30 mg via INTRAVENOUS

## 2023-06-17 MED ORDER — MIDAZOLAM HCL 5 MG/5ML IJ SOLN
INTRAMUSCULAR | Status: DC | PRN
  Administered 2023-06-17: 2 mg via INTRAVENOUS
  Administered 2023-06-17 (×2): 1 mg via INTRAVENOUS

## 2023-06-17 MED ORDER — MORPHINE SULFATE (PF) 4 MG/ML IV SOLN
INTRAVENOUS | Status: AC
  Filled 2023-06-17: qty 1

## 2023-06-17 MED ORDER — ACETAMINOPHEN 325 MG PO TABS: 325.0000 mg | ORAL_TABLET | Freq: Four times a day (QID) | ORAL | Status: DC | PRN

## 2023-06-17 MED ORDER — FENTANYL CITRATE (PF) 100 MCG/2ML IJ SOLN
INTRAMUSCULAR | Status: DC | PRN
  Administered 2023-06-17 (×4): 25 ug via INTRAVENOUS

## 2023-06-17 MED ORDER — FLUOXETINE HCL 20 MG PO CAPS
20.0000 mg | ORAL_CAPSULE | Freq: Every day | ORAL | Status: DC
  Administered 2023-06-17: 20 mg via ORAL

## 2023-06-17 MED ORDER — MIDAZOLAM HCL 2 MG/2ML IJ SOLN
INTRAMUSCULAR | Status: AC
  Filled 2023-06-17: qty 2

## 2023-06-17 MED ORDER — PHENOL 1.4 % MT LIQD: 1.0000 | OROMUCOSAL | Status: DC | PRN

## 2023-06-17 MED ORDER — ACETAMINOPHEN 500 MG PO TABS
1000.0000 mg | ORAL_TABLET | Freq: Three times a day (TID) | ORAL | Status: DC
  Administered 2023-06-17 – 2023-06-18 (×3): 1000 mg via ORAL

## 2023-06-17 MED ORDER — ORAL CARE MOUTH RINSE: 15.0000 mL | Freq: Once | OROMUCOSAL | Status: AC

## 2023-06-17 MED ORDER — VITAMIN D 25 MCG (1000 UNIT) PO TABS
1000.0000 [IU] | ORAL_TABLET | Freq: Every day | ORAL | Status: DC
Start: 2023-06-17 — End: 2023-06-18
  Administered 2023-06-18: 1000 [IU] via ORAL

## 2023-06-17 MED ORDER — PHENYLEPHRINE 80 MCG/ML (10ML) SYRINGE FOR IV PUSH (FOR BLOOD PRESSURE SUPPORT)
PREFILLED_SYRINGE | INTRAVENOUS | Status: AC
  Filled 2023-06-17: qty 10

## 2023-06-17 MED ORDER — BUPIVACAINE LIPOSOME 1.3 % IJ SUSP
INTRAMUSCULAR | Status: AC
  Filled 2023-06-17: qty 20

## 2023-06-17 MED ORDER — PROPOFOL 500 MG/50ML IV EMUL
INTRAVENOUS | Status: DC | PRN
  Administered 2023-06-17: 70 ug/kg/min via INTRAVENOUS

## 2023-06-17 MED ORDER — ONDANSETRON HCL 4 MG/2ML IJ SOLN
INTRAMUSCULAR | Status: DC | PRN
  Administered 2023-06-17: 4 mg via INTRAVENOUS

## 2023-06-17 MED ORDER — MENTHOL 3 MG MT LOZG: 1.0000 | LOZENGE | OROMUCOSAL | Status: DC | PRN

## 2023-06-17 MED ORDER — DEXAMETHASONE SODIUM PHOSPHATE 10 MG/ML IJ SOLN
8.0000 mg | Freq: Once | INTRAMUSCULAR | Status: DC
Start: 2023-06-17 — End: 2023-06-17

## 2023-06-17 MED ORDER — FENTANYL CITRATE (PF) 100 MCG/2ML IJ SOLN: 25.0000 ug | INTRAMUSCULAR | Status: DC | PRN

## 2023-06-17 MED ORDER — FLUOXETINE HCL 20 MG PO CAPS
ORAL_CAPSULE | ORAL | Status: AC
  Filled 2023-06-17: qty 1

## 2023-06-17 MED ORDER — KETOROLAC TROMETHAMINE 15 MG/ML IJ SOLN
7.5000 mg | Freq: Four times a day (QID) | INTRAMUSCULAR | Status: DC
Start: 2023-06-17 — End: 2023-06-18
  Administered 2023-06-17 – 2023-06-18 (×3): 7.5 mg via INTRAVENOUS

## 2023-06-17 MED ORDER — BUPIVACAINE-EPINEPHRINE (PF) 0.25% -1:200000 IJ SOLN
INTRAMUSCULAR | Status: AC
  Filled 2023-06-17: qty 30

## 2023-06-17 MED ORDER — TRAMADOL HCL 50 MG PO TABS: 50.0000 mg | ORAL_TABLET | Freq: Four times a day (QID) | ORAL | Status: DC | PRN

## 2023-06-17 SURGICAL SUPPLY — 67 items
BLADE PATELLA REAM PILOT HOLE (MISCELLANEOUS) IMPLANT
BLADE SAGITTAL AGGR TOOTH XLG (BLADE) IMPLANT
BLADE SAW SAG 25X90X1.19 (BLADE) ×1 IMPLANT
BLADE SAW SAG 29X58X.64 (BLADE) ×1 IMPLANT
BNDG ELASTIC 6INX 5YD STR LF (GAUZE/BANDAGES/DRESSINGS) ×1 IMPLANT
BOWL CEMENT MIX W/ADAPTER (MISCELLANEOUS) ×1 IMPLANT
CHLORAPREP W/TINT 26 (MISCELLANEOUS) ×2 IMPLANT
COMP FEM KNEE STD PS 7 LT (Joint) ×1 IMPLANT
COMP PATELLA PEG 3 32 (Joint) ×1 IMPLANT
COMPONENT FEM KNEE STD PS 7 LT (Joint) IMPLANT
COMPONENT PATELLA PEG 3 32 (Joint) IMPLANT
COOLER POLAR GLACIER W/PUMP (MISCELLANEOUS) ×1 IMPLANT
CUFF TRNQT CYL 24X4X16.5-23 (TOURNIQUET CUFF) IMPLANT
CUFF TRNQT CYL 30X4X21-28X (TOURNIQUET CUFF) IMPLANT
DERMABOND ADVANCED .7 DNX12 (GAUZE/BANDAGES/DRESSINGS) ×1 IMPLANT
DRAPE 3/4 80X56 (DRAPES) ×1 IMPLANT
DRAPE INCISE IOBAN 66X60 STRL (DRAPES) IMPLANT
DRSG MEPILEX SACRM 8.7X9.8 (GAUZE/BANDAGES/DRESSINGS) ×1 IMPLANT
DRSG OPSITE POSTOP 4X10 (GAUZE/BANDAGES/DRESSINGS) IMPLANT
DRSG OPSITE POSTOP 4X8 (GAUZE/BANDAGES/DRESSINGS) IMPLANT
ELECT REM PT RETURN 9FT ADLT (ELECTROSURGICAL) ×1
ELECTRODE REM PT RTRN 9FT ADLT (ELECTROSURGICAL) ×1 IMPLANT
GLOVE BIO SURGEON STRL SZ8 (GLOVE) ×1 IMPLANT
GLOVE BIOGEL PI IND STRL 8 (GLOVE) ×1 IMPLANT
GLOVE PI ORTHO PRO STRL 7.5 (GLOVE) ×2 IMPLANT
GLOVE PI ORTHO PRO STRL SZ8 (GLOVE) ×2 IMPLANT
GLOVE SURG SYN 7.5 E (GLOVE) ×1 IMPLANT
GLOVE SURG SYN 7.5 PF PI (GLOVE) ×1 IMPLANT
GOWN SRG XL LVL 3 NONREINFORCE (GOWNS) ×1 IMPLANT
GOWN STRL REUS W/ TWL LRG LVL3 (GOWN DISPOSABLE) ×1 IMPLANT
GOWN STRL REUS W/ TWL XL LVL3 (GOWN DISPOSABLE) ×1 IMPLANT
HOOD PEEL AWAY T7 (MISCELLANEOUS) ×2 IMPLANT
IV NS IRRIG 3000ML ARTHROMATIC (IV SOLUTION) ×1 IMPLANT
KIT TURNOVER KIT A (KITS) ×1 IMPLANT
MANIFOLD NEPTUNE II (INSTRUMENTS) ×1 IMPLANT
MARKER SKIN DUAL TIP RULER LAB (MISCELLANEOUS) ×1 IMPLANT
MAT ABSORB FLUID 56X50 GRAY (MISCELLANEOUS) ×1 IMPLANT
NDL HYPO 21X1.5 SAFETY (NEEDLE) ×1 IMPLANT
NEEDLE HYPO 21X1.5 SAFETY (NEEDLE) ×1 IMPLANT
PACK TOTAL KNEE (MISCELLANEOUS) ×1 IMPLANT
PAD ARMBOARD 7.5X6 YLW CONV (MISCELLANEOUS) ×3 IMPLANT
PAD WRAPON POLAR KNEE (MISCELLANEOUS) ×1 IMPLANT
PENCIL SMOKE EVACUATOR (MISCELLANEOUS) ×1 IMPLANT
PIN DRILL HDLS TROCAR 75 4PK (PIN) IMPLANT
PULSAVAC PLUS IRRIG FAN TIP (DISPOSABLE) ×1
Patella Reamer Blade IMPLANT
SCREW FEMALE HEX FIX 25X2.5 (ORTHOPEDIC DISPOSABLE SUPPLIES) IMPLANT
SCREW HEX HEADED 3.5X27 DISP (ORTHOPEDIC DISPOSABLE SUPPLIES) IMPLANT
SLEEVE SCD COMPRESS KNEE MED (STOCKING) ×1 IMPLANT
SOLUTION IRRIG SURGIPHOR (IV SOLUTION) ×1 IMPLANT
STEM MED AS PERS SZ6-7 11 (Stem) IMPLANT
STEM TIB PERS SZ E 5D LT (Screw) IMPLANT
SUT STRATA 1 CT-1 DLB (SUTURE) ×2
SUT STRATAFIX 14 PDO 48 VLT (SUTURE) ×1 IMPLANT
SUT STRATAFIX PDO 1 14 VIOLET (SUTURE) ×1
SUT VIC AB 0 CT1 36 (SUTURE) ×1 IMPLANT
SUT VIC AB 2-0 CT2 27 (SUTURE) ×2 IMPLANT
SUT VICRYL 1-0 27IN ABS (SUTURE) ×1
SUTURE STRATA SPIR 4-0 18 (SUTURE) ×1 IMPLANT
SUTURE VICRYL 1-0 27IN ABS (SUTURE) ×1 IMPLANT
SYR 30ML LL (SYRINGE) ×2 IMPLANT
TAPE CLOTH 3X10 WHT NS LF (GAUZE/BANDAGES/DRESSINGS) ×1 IMPLANT
TIP FAN IRRIG PULSAVAC PLUS (DISPOSABLE) ×1 IMPLANT
TOWEL OR 17X26 4PK STRL BLUE (TOWEL DISPOSABLE) IMPLANT
TRAP FLUID SMOKE EVACUATOR (MISCELLANEOUS) ×1 IMPLANT
WATER STERILE IRR 1000ML POUR (IV SOLUTION) ×1 IMPLANT
WRAPON POLAR PAD KNEE (MISCELLANEOUS) ×1

## 2023-06-17 NOTE — Transfer of Care (Signed)
Immediate Anesthesia Transfer of Care Note  Patient: MONTINIQUE CZARNY  Procedure(s) Performed: TOTAL KNEE ARTHROPLASTY (Left: Knee)  Patient Location: PACU  Anesthesia Type:Spinal  Level of Consciousness: awake and alert   Airway & Oxygen Therapy: Patient Spontanous Breathing and Patient connected to nasal cannula oxygen  Post-op Assessment: Report given to RN and Post -op Vital signs reviewed and stable  Post vital signs: Reviewed and stable  Last Vitals:  Vitals Value Taken Time  BP 114/57 06/17/23 1207  Temp 36.6 C 06/17/23 1207  Pulse 82 06/17/23 1211  Resp 12 06/17/23 1211  SpO2 99 % 06/17/23 1211  Vitals shown include unfiled device data.  Last Pain:  Vitals:   06/17/23 1207  TempSrc:   PainSc: 0-No pain         Complications: No notable events documented.

## 2023-06-17 NOTE — Op Note (Signed)
Patient Name: [name]  WUJ:[id number]  Pre-Operative Diagnosis: Left knee Osteoarthritis  Post-Operative Diagnosis: (same)  Procedure: Left Total Knee Arthroplasty  Components/Implants: Femur: Persona Size 7 CR PPS   Tibia: Persona Size E Keel spiked OsseoTi  Poly: 11 mm MC  Patella: 32x59mm symmetric 3 peg OsseoTi  Femoral Valgus Cut Angle: 5 degrees  Distal Femoral Re-cut: none  Patella Resurfacing: yes   Date of Surgery: 06/17/2023  Surgeon: Reinaldo Berber MD  Assistant: Amador Cunas PA (present and scrubbed throughout the case, critical for assistance with exposure, retraction, instrumentation, and closure)   Anesthesiologist: Suzan Slick  Anesthesia: Spinal   Tourniquet Time: 47 min  EBL: 75 cc  IVF: 600cc  Complications: None   Brief history: The patient is a 62 year old female with a history of osteoarthritis of the left knee with pain limiting their range of motion and activities of daily living, which has failed multiple attempts at conservative therapy.  The risks and benefits of total knee arthroplasty as definitive surgical treatment were discussed with the patient, who opted to proceed with the operation.  After outpatient medical clearance and optimization was completed the patient was admitted to [facility] for the procedure.  All preoperative films were reviewed and an appropriate surgical plan was made prior to surgery. Preoperative range of motion was 5 to 95 with a 5 flexion contracture. The patient was identified as having a varus alignment.   Description of procedure: The patient was brought to the operating room where laterality was confirmed by all those present to be the left side.   Spinal anesthesia was administered and the patient received an intravenous dose of antibiotics for surgical prophylaxis and a dose of tranexamic acid.  Patient is positioned supine on the operating room table with all bony prominences well-padded.  A  well-padded tourniquet was applied to the left thigh.  The knee was then prepped and draped in usual sterile fashion with multiple layers of adhesive and nonadhesive drapes.  All of those present in the operating room participated in a surgical timeout laterality and patient were confirmed.   An Esmarch was wrapped around the extremity and the leg was elevated and the knee flexed.  The tourniquet was inflated to a pressure of 275 mmHg. The Esmarch was removed and the leg was brought down to full extension.  The patella and tibial tubercle identified and outlined using a marking pen and a midline skin incision was made with a knife carried through the subcutaneous tissue down to the extensor retinaculum.  After exposure of the extensor mechanism the medial parapatellar arthrotomy was performed with a scalpel and electrocautery extending down medial and distal to the tibial tubercle taking care to avoid incising the patellar tendon.   A standard medial release was performed over the proximal tibia.  The knee was brought into extension in order to excise the fat pad taking care not to damage the patella tendon.  The superior soft tissue was removed from the anterior surface of the distal femur to visualize for the procedure.  The knee was then brought into flexion with the patella subluxed laterally and subluxing the tibia anteriorly.  The ACL was transected and removed with electrocautery and additional soft tissue was removed from the proximal surface of the tibia to fully expose. The PCL was found to be intact and was preserved.  An extramedullary tibial cutting guide was then applied to the leg with a spring-loaded ankle clamp placed around the distal tibia just above  the malleoli the angulation of the guide was adjusted to give some posterior slope in the tibial resection with an appropriate varus/valgus alignment.  The resection guide was then pinned to the proximal tibia and the proximal tibial surface was  resected with an oscillating saw.  Careful attention was paid to ensure the blade did not disrupt any of the soft tissues including any lateral or medial ligament.  Attention was then turned to the femur, with the knee slightly flexed a opening drill was used to enter the medullary canal of the femur.  After removing the drill marrow was suctioned out to decompress the distal femur.  An intramedullary femoral guide was then inserted into the drill hole and the alignment guide was seated firmly against the distal end of the medial femoral condyle.  The distal femoral cutting guide was then attached and pinned securely to the anterior surface of the femur and the intramedullary rod and alignment guide was removed.  Distal femur resection was then performed with an oscillating saw with retractors protecting medial and laterally.   The distal cutting block was then removed and the extension gap was checked with a spacer.  Extension gap was found to be appropriately sized to accommodate the spacer block.   The femoral sizing guide was then placed securely into the posterior condyles of the femur and the femoral size was measured and determined to be 7.  The size 7; 4-in-1 cutting guide was placed in position and secured with 2 pins.  The anterior posterior and chamfer resections were then performed with an oscillating saw.  Bony fragments and osteophytes were then removed.  Using a lamina spreader the posterior medial and lateral condyles were checked for additional osteophytes and posterior soft tissue remnants.  Any remaining meniscus was removed at this time.  Periarticular injection was performed in the meniscal rims and posterior capsule with aspiration performed to ensure no intravascular injection.   The tibia was then exposed and the tibial trial was pinned onto the plateau after confirming appropriate orientation and rotation.  Using the drill bushing the tibia was prepared to the appropriate drill  depth.  Tibial broach impactor was then driven through the punch guide using a mallet.  The femoral trial component was then inserted onto the femur. A trial tibial polyethylene bearing was then placed and the knee was reduced.  The knee achieved full extension with no hyperextension and was found to be balanced in flexion and extension with the trials in place.  The knee was then brought into full extension the patella was everted and held with 2 Kocher clamps.  The articular surface of the patella was then resected with an patella reamer and saw after careful measurement with a caliper.  The patella was then prepared with the drill guide and a trial patella was placed.  The knee was then taken through range of motion and it was found that the patella articulated appropriately with the trochlea and good patellofemoral motion without subluxation.    The correct final components for implantation were confirmed and opened by the circulator nurse.  The knee was irrigated with normal saline via pulsatile lavage to remove any bony debris or soft tissue.  The prepared surface of the tibia was exposed and the tibial component was implanted with good bony contact.  The femoral component was then placed and impacted showing good coverage and a good snug fit.  The patella was then cleared off and the patella compression tool was used  to apply the patellar component with symmetric compression onto the patella.  The tibial component was then irrigated and cleared of any debris and a real polyethylene component was placed and engaged with the locking mechanism.  The knee was then injected with the particular cocktail.  The knee was taken through range of motion and found to be stable in flexion and extension with patellar tracking.  The knee was then irrigated with copious amount of normal saline via pulsatile lavage to remove all loose bodies and other debris.  The knee was then irrigated with surgiphor betadine based wash  and reirrigated with saline.  The tourniquet was then dropped and all bleeding vessels were identified and coagulated.  The arthrotomy was approximated with #1 Vicryl and closed with #2 Quill suture.  The knee was brought into slight flexion and the subcutaneous tissues were closed with 0 Vicryl, 2-0 Vicryl and a running subcuticular 4-0 stratafix barbed suture.  Skin was then glued with Dermabond.  A sterile adhesive dressing was then placed along with a sequential compression device to the calf, a Ted stocking, and a cryotherapy cuff.   Sponge, needle, and Lap counts were all correct at the end of the case.   The patient was transferred off of the operating room table to a hospital bed, good pulses were found distally on the operative side.  The patient was transferred to the recovery room in stable condition.

## 2023-06-17 NOTE — H&P (Signed)
History of Present Illness: The patient is an 62 y.o. female seen in clinic today for evaluation of her left knee. The patient reports she has had pain in her left knee for several years. She is undergone multiple series of injections with steroids and viscosupplementation shots. She has been taking meloxicam daily and despite treatment with injections and conservative medications the patient has had worsening pain and decreasing function relative to her left knee. She describes a constant sharp shooting pain which significantly increases with activity and going up and down stairs and improves with rest. She reports some improvement with ice. At this point she is interested in a more permanent solution for her knee pain which can get up to a 7 or 8 out of 10. The patient denies fevers, chills, numbness, tingling, shortness of breath, chest pain, recent illness, or any trauma.  Patient is a non-smoker with a BMI of 38.3, she is a nondiabetic with an A1c of 5.9  Past Medical History: Past Medical History:  Diagnosis Date  Allergic state  Depression  Hypertension  Sleep apnea   Past Surgical History: Past Surgical History:  Procedure Laterality Date  COLONOSCOPY 01/01/2019  [facility] (Mother/Brother) CBF 12/2023  CHOLECYSTECTOMY  HYSTERECTOMY  MASTECTOMY PARTIAL / LUMPECTOMY  TONSILLECTOMY   Past Family History: Family History  Problem Relation Age of Onset  Bipolar disorder Mother  Colon polyps Mother  High blood pressure (Hypertension) Father  Colon polyps Maternal Aunt  Colon cancer Paternal Uncle  Colon cancer Paternal Uncle  Colon cancer Paternal Uncle  Colon polyps Brother  Colon polyps Brother   Medications: Current Outpatient Medications  Medication Sig Dispense Refill  buPROPion (WELLBUTRIN XL) 150 MG XL tablet take 1 tablet by mouth every day 90 tablet 1  cetirizine (ZYRTEC) 10 MG tablet TAKE 1 TABLET BY MOUTH EVERY DAY 30 tablet 1  cholecalciferol (VITAMIN D3) 2,000 unit  capsule Take 2,000 Units by mouth once daily.  FLUoxetine (PROZAC) 20 MG capsule take 1 capsule by mouth every day 90 capsule 1  fluticasone (FLONASE) 50 mcg/actuation nasal spray Place 2 sprays into both nostrils once daily 16 g 6  glucosam-chond-msm1-C-mang-bor 375-500-15-0.5 mg Tab Take by mouth once daily.  levothyroxine (SYNTHROID) 137 MCG tablet take 1 tablet by mouth once a day 90 tablet 1  meloxicam (MOBIC) 15 MG tablet TAKE 1 TABLET BY MOUTH ONCE DAILY AS NEEDED. 90 tablet 1  methylPREDNISolone (MEDROL DOSEPACK) 4 mg tablet Follow package directions. 21 tablet 0  montelukast (SINGULAIR) 10 mg tablet take 1 tablet by mouth every day at night 90 tablet 1  multivitamin tablet Take 1 tablet by mouth once daily.  pantoprazole (PROTONIX) 40 MG DR tablet take 1 tablet by mouth every day 90 tablet 3  triamterene-hydroCHLOROthiazide (MAXZIDE-25) 37.5-25 mg tablet take 1 tablet by mouth every day 90 tablet 1   No current facility-administered medications for this visit.   Allergies: Allergies  Allergen Reactions  Cephalosporins Hives  Sulfa (Sulfonamide Antibiotics) Other (See Comments)  SOB    Visit Vitals: Vitals:  05/10/23 1112  BP: 118/80    Review of Systems:  A comprehensive 14 point ROS was performed, reviewed, and the pertinent orthopaedic findings are documented in the HPI.  Physical Exam: General/Constitutional: No apparent distress: well-nourished and well developed. Eyes: Pupils equal, round with synchronous movement. Pulmonary exam: Lungs clear to auscultation bilaterally no wheezing rales or rhonchi Cardiac exam: Regular rate and rhythm no obvious murmurs rubs or gallops. Integumentary: No impressive skin lesions present, except as  noted in detailed exam. Neuro/Psych: Normal mood and affect, oriented to person, place and time.  Comprehensive Knee Exam: Gait Antalgic on the left  Alignment Neutral   Inspection Right Left  Skin Normal appearance with no obvious  deformity. No ecchymosis or erythema. Normal appearance with no obvious deformity. No ecchymosis or erythema.  Soft Tissue No focal soft tissue swelling No focal soft tissue swelling  Quad Atrophy None None   Palpation  Right Left  Tenderness No peripatellar, patellar tendon, quad tendon, medial/lateral joint line pain Medial joint line tenderness to palpation  Crepitus No patellofemoral or tibiofemoral crepitus No patellofemoral or tibiofemoral crepitus  Effusion None None   Range of Motion Right Left  Flexion Normal AROM 5-95  Extension Full knee extension without hyperextension Full knee extension without hyperextension   Ligamentous Exam Right Left  Lachman Normal Normal  Valgus 0 Normal Normal  Valgus 30 Normal Normal  Varus 0 Normal Normal  Varus 30 Normal Normal  Anterior Drawer Normal Normal  Posterior Drawer Normal Normal   Meniscal Exam Right Left  Hyperflexion Test Negative Positive  Hyperextension Test Negative Positive  McMurray's Negative Negative   Neurovascular Right Left  Quadriceps Strength 5/5 5/5  Hamstring Strength 5/5 5/5  Hip Abductor Strength 4/5 4/5  Distal Motor Normal Normal  Distal Sensory Normal light touch sensation Normal light touch sensation  Distal Pulses Normal Normal    Imaging Studies: I have reviewed AP, lateral,sunrise, and flexed PA weight bearing knee X-rays (4 views) of the left knee ordered and taken today in the office show moderate to severe degenerative changes of the left knee with medial joint space narrowing with bone-on-bone articulation and lateral subluxation of the tibia relative to the femur. There is osteophyte formation sclerosis and subchondral cyst formation. There is also patellofemoral degeneration with joint space narrowing sclerosis and osteophyte formation. Kellgren-Lawrence grade 4. AP flex PA and sunrise view of the right knee show moderate degenerative changes with medial joint space narrowing and sclerosis  and some early osteophyte formation. No fractures or dislocations noted in either knee.   Assessment:  Left knee osteoarthritis  Plan: [name] is a 62 year old female presents with left knee bone on bone arthritis. Based upon the patient's continued symptoms and failure to respond to conservative treatment, I have recommended a left total knee replacement for this patient. A long discussion took place with the patient describing what a total joint replacement is and what the procedure would entail. A knee model, similar to the implants that will be used during the operation, was utilized to demonstrate the implants. Choices of implant manufactures were discussed and reviewed. The ability to secure the implant utilizing cement or cementless (press fit) fixation was discussed. The approach and exposure was discussed.   The hospitalization and post-operative care and rehabilitation were also discussed. The use of perioperative antibiotics and DVT prophylaxis were discussed. The risk, benefits and alternatives to a surgical intervention were discussed at length with the patient. The patient was also advised of risks related to the medical comorbidities and elevated body mass index (BMI). A lengthy discussion took place to review the most common complications including but not limited to: stiffness, loss of function, complex regional pain syndrome, deep vein thrombosis, pulmonary embolus, heart attack, stroke, infection, wound breakdown, numbness, intraoperative fracture, damage to nerves, tendon,muscles, arteries or other blood vessels, death and other possible complications from anesthesia. The patient was told that we will take steps to minimize these risks by using sterile technique,  antibiotics and DVT prophylaxis when appropriate and follow the patient postoperatively in the office setting to monitor progress. The possibility of recurrent pain, no improvement in pain and actual worsening of pain were also  discussed with the patient.   The discharge plan of care focused on the patient going home following surgery. The patient was encouraged to make the necessary arrangements to have someone stay with them when they are discharged home.   The benefits of surgery were discussed with the patient including the potential for improving the patient's current clinical condition through operative intervention. Alternatives to surgical intervention including continued conservative management were also discussed in detail. All questions were answered to the satisfaction of the patient. The patient participated and agreed to the plan of care as well as the use of the recommended implants for their total knee replacement surgery. An information packet was given to the patient to review prior to surgery.   Patient received medical clearance for surgyer. All questions answered and patient agrees to the above plan make preparations for a left total knee replacement.  Portions of this record have been created using Scientist, clinical (histocompatibility and immunogenetics). Dictation errors have been sought, but may not have been identified and corrected.  Reinaldo Berber MD

## 2023-06-17 NOTE — Evaluation (Signed)
Physical Therapy Evaluation Patient Details Name: [name] MRN: [id number] DOB: [birth year] Today's Date: 06/17/2023  History of Present Illness  Pt is a 62 y.o. female s/p L TKA secondary to OA 06/17/23.  PMH includes sleep apnea, htn, thyroid disease.  Clinical Impression  Prior to surgery, pt was independent with ambulation/mobility (limited d/t L knee pain); lives with her husband in 1 level home with 2 STE (no railing).  7/10 L knee pain beginning/end of session at rest but pain increased with activity (pt pre-medicated with pain meds).  Able to perform L LE SLR independently (pt requiring assist with some L LE ex's in bed though to assist with pain control).  Unable to finish LE ex's in bed (or advance to OOB mobility) d/t pt becoming very nauseas and feeling like she was going to throw up (nurse notified immediately and brought pt nausea medication; cold washcloth placed on pt's forehead).  Encouraged pt to sit on EOB later today with nursing assist once she was feeling better.  Pt would currently benefit from skilled PT to address noted impairments and functional limitations (see below for any additional details).  Upon hospital discharge, anticipate pt would benefit from ongoing therapy.     If plan is discharge home, recommend the following: A little help with walking and/or transfers;A little help with bathing/dressing/bathroom;Assistance with cooking/housework;Assist for transportation;Help with stairs or ramp for entrance   Can travel by private vehicle        Equipment Recommendations Rolling walker (2 wheels);BSC/3in1  Recommendations for Other Services       Functional Status Assessment Patient has had a recent decline in their functional status and demonstrates the ability to make significant improvements in function in a reasonable and predictable amount of time.     Precautions / Restrictions Precautions Precautions: Fall;Knee Precaution Booklet Issued: Yes  (comment) Restrictions Weight Bearing Restrictions: Yes LLE Weight Bearing: Weight bearing as tolerated      Mobility  Bed Mobility               General bed mobility comments: Deferred d/t pt becoming nauseas (and felt like throwing up) with LE ex's in bed    Transfers                        Ambulation/Gait                  Stairs            Wheelchair Mobility     Tilt Bed    Modified Rankin (Stroke Patients Only)       Balance                                             Pertinent Vitals/Pain Pain Assessment Pain Assessment: 0-10 Pain Score: 7  Pain Location: L knee Pain Descriptors / Indicators: Constant, Sore, Tender, Aching Pain Intervention(s): Limited activity within patient's tolerance, Monitored during session, Premedicated before session, Repositioned, Other (comment) (polar care applied) Vitals (HR and SpO2 on room air) stable and WFL throughout treatment session.    Home Living Family/patient expects to be discharged to:: Private residence Living Arrangements: Spouse/significant other Available Help at Discharge: Family;Available 24 hours/day Type of Home: House Home Access: Stairs to enter Entrance Stairs-Rails: None Entrance Stairs-Number of Steps: 2   Home Layout: One level Home Equipment:  Grab bars - tub/shower;Shower seat - built in Additional Comments: Husband retired.    Prior Function Prior Level of Function : Independent/Modified Independent             Mobility Comments: Independent with ambulation; (+) working.       Extremity/Trunk Assessment   Upper Extremity Assessment Upper Extremity Assessment: Overall WFL for tasks assessed    Lower Extremity Assessment Lower Extremity Assessment: LLE deficits/detail (R LE WFL) LLE Deficits / Details: good L isometric quad set; able to perform L LE SLR independently; at least 3/5 AROM ankle DF/PF LLE: Unable to fully assess due to  pain    Cervical / Trunk Assessment Cervical / Trunk Assessment: Normal  Communication   Communication Communication: No apparent difficulties Cueing Techniques: Verbal cues  Cognition Arousal: Alert Behavior During Therapy: WFL for tasks assessed/performed Overall Cognitive Status: Within Functional Limits for tasks assessed                                          General Comments General comments (skin integrity, edema, etc.): L LE dressing and polar care in place.  Nursing cleared pt for participation in physical therapy.  Pt agreeable to PT session.  Pt's husband present end of session.    Exercises Total Joint Exercises Ankle Circles/Pumps: AROM, Strengthening, Both, 10 reps, Supine Quad Sets: AROM, Strengthening, Both, 10 reps, Supine Heel Slides: AAROM, Strengthening, Left (7 reps (limited d/t nausea)) Hip ABduction/ADduction: AAROM, Strengthening, Left, 10 reps, Supine Straight Leg Raises: AAROM, Strengthening, Left, 10 reps, Supine (therapist provided assist for pain control but pt able to perform independently otherwise) Goniometric ROM: L knee extension 5 degrees short of neutral; L knee flexion approximately at least 40 degrees with LE ex's in bed (limited d/t nausea)   Assessment/Plan    PT Assessment Patient needs continued PT services  PT Problem List Decreased strength;Decreased range of motion;Decreased activity tolerance;Decreased mobility;Decreased knowledge of use of DME;Decreased knowledge of precautions;Pain;Decreased skin integrity       PT Treatment Interventions DME instruction;Gait training;Stair training;Functional mobility training;Therapeutic activities;Therapeutic exercise;Balance training;Patient/family education    PT Goals (Current goals can be found in the Care Plan section)  Acute Rehab PT Goals Patient Stated Goal: to improve pain and walking PT Goal Formulation: With patient Time For Goal Achievement: 07/01/23 Potential  to Achieve Goals: Good    Frequency BID     Co-evaluation               AM-PAC PT "6 Clicks" Mobility  Outcome Measure Help needed turning from your back to your side while in a flat bed without using bedrails?: None (Anticipated level of assist) Help needed moving from lying on your back to sitting on the side of a flat bed without using bedrails?: A Little (Anticipated level of assist) Help needed moving to and from a bed to a chair (including a wheelchair)?: A Little (Anticipated level of assist) Help needed standing up from a chair using your arms (e.g., wheelchair or bedside chair)?: A Lot (Anticipated level of assist) Help needed to walk in hospital room?: A Little (Anticipated level of assist) Help needed climbing 3-5 steps with a railing? : A Lot (Anticipated level of assist) 6 Click Score: 17    End of Session   Activity Tolerance: Other (comment) (Limited d/t nausea) Patient left: in bed;with call bell/phone within reach;with bed alarm set;with family/visitor  present;with SCD's reapplied;Other (comment) (L heel floating via towel roll; polar care in place) Nurse Communication: Mobility status;Precautions;Weight bearing status;Other (comment) (pt's pain and nausea) PT Visit Diagnosis: Other abnormalities of gait and mobility (R26.89);Muscle weakness (generalized) (M62.81);Pain Pain - Right/Left: Left Pain - part of body: Knee    Time: 9629-5284 PT Time Calculation (min) (ACUTE ONLY): 24 min   Charges:   PT Evaluation $PT Eval Low Complexity: 1 Low PT Treatments $Therapeutic Exercise: 8-22 mins PT General Charges $$ ACUTE PT VISIT: 1 Visit        Hendricks Limes, PT 06/17/23, 5:08 PM

## 2023-06-17 NOTE — Anesthesia Preprocedure Evaluation (Addendum)
Anesthesia Evaluation  Patient identified by MRN, date of birth, ID band Patient awake    Reviewed: Allergy & Precautions, NPO status , Patient's Chart, lab work & pertinent test results  History of Anesthesia Complications Negative for: history of anesthetic complications  Airway Mallampati: IV  TM Distance: <3 FB Neck ROM: Full    Dental  (+) Teeth Intact   Pulmonary sleep apnea and Continuous Positive Airway Pressure Ventilation , neg COPD, Patient abstained from smoking.Not current smoker   Pulmonary exam normal breath sounds clear to auscultation       Cardiovascular Exercise Tolerance: Good METShypertension, Pt. on medications (-) CAD and (-) Past MI (-) dysrhythmias  Rhythm:Regular Rate:Normal - Systolic murmurs    Neuro/Psych  PSYCHIATRIC DISORDERS Anxiety Depression    negative neurological ROS     GI/Hepatic ,GERD  Medicated and Controlled,,(+)     (-) substance abuse    Endo/Other  neg diabetesHypothyroidism    Renal/GU negative Renal ROS     Musculoskeletal   Abdominal   Peds  Hematology   Anesthesia Other Findings Past Medical History: No date: Anxiety 07/16/1961: Cystitis No date: Depression No date: GERD (gastroesophageal reflux disease) No date: History of blood transfusion     Comment:  after tonsillectomy-age 62 No date: Hypertension No date: Hypothyroidism No date: Pre-diabetes No date: Sleep apnea     Comment:  does not use-could not tolerate 07/17/1959: Thyroid disease  Reproductive/Obstetrics                             Anesthesia Physical Anesthesia Plan  ASA: 2  Anesthesia Plan: Spinal   Post-op Pain Management: Ofirmev IV (intra-op)*   Induction: Intravenous  PONV Risk Score and Plan: 2 and Ondansetron, Dexamethasone, Propofol infusion, TIVA and Midazolam  Airway Management Planned: Natural Airway  Additional Equipment: None  Intra-op Plan:    Post-operative Plan:   Informed Consent: I have reviewed the patients History and Physical, chart, labs and discussed the procedure including the risks, benefits and alternatives for the proposed anesthesia with the patient or authorized representative who has indicated his/her understanding and acceptance.       Plan Discussed with: CRNA and Surgeon  Anesthesia Plan Comments: (Discussed R/B/A of neuraxial anesthesia technique with patient: - rare risks of spinal/epidural hematoma, nerve damage, infection - Risk of PDPH - Risk of nausea and vomiting - Risk of conversion to general anesthesia and its associated risks, including sore throat, damage to lips/eyes/teeth/oropharynx, and rare risks such as cardiac and respiratory events. - Risk of allergic reactions  Patient has listed allergy to "cephalosporins" - hives over 20 years ago. She is unsure which specific antibiotic it was, only that she got it for "a bad cold". Severe blistering skin reaction (SJS/TEN)? no Liver or kidney injury caused by PCN? no Hemolytic anemia from PCN? no Drug fever? no Painful swollen joints? no Severe reaction involving inside of mouth, eye, or genital ulcers? no Based on current evidence Elizebeth Koller et al, J Allergy Clin Immunol Pract, 2019), will proceed with cefazolin use: Yes    Discussed the role of CRNA in patient's perioperative care.  Patient voiced understanding.)       Anesthesia Quick Evaluation

## 2023-06-17 NOTE — Anesthesia Procedure Notes (Signed)
Spinal  Patient location during procedure: OR Start time: 06/17/2023 10:17 AM End time: 06/17/2023 10:18 AM Reason for block: surgical anesthesia Staffing Resident/CRNA: Monico Hoar, CRNA Performed by: Monico Hoar, CRNA Authorized by: Corinda Gubler, MD   Preanesthetic Checklist Completed: patient identified, IV checked, site marked, risks and benefits discussed, surgical consent, monitors and equipment checked, pre-op evaluation and timeout performed Spinal Block Patient position: sitting Prep: DuraPrep Patient monitoring: heart rate, cardiac monitor, continuous pulse ox and blood pressure Approach: midline Location: L3-4 Injection technique: single-shot Needle Needle type: Pencan  Needle gauge: 24 G Needle length: 9 cm Assessment Sensory level: T4 Events: CSF return

## 2023-06-17 NOTE — Interval H&P Note (Signed)
Patient history and physical updated. Consent reviewed including risks, benefits, and alternatives to surgery. Patient agrees with above plan to proceed with left total knee arthroplasty.

## 2023-06-18 ENCOUNTER — Encounter: Payer: Self-pay | Admitting: Orthopedic Surgery

## 2023-06-18 DIAGNOSIS — M1712 Unilateral primary osteoarthritis, left knee: Secondary | ICD-10-CM | POA: Diagnosis not present

## 2023-06-18 LAB — BASIC METABOLIC PANEL
Anion gap: 8 (ref 5–15)
BUN: 12 mg/dL (ref 8–23)
CO2: 26 mmol/L (ref 22–32)
Calcium: 7.9 mg/dL — ABNORMAL LOW (ref 8.9–10.3)
Chloride: 99 mmol/L (ref 98–111)
Creatinine, Ser: 0.76 mg/dL (ref 0.44–1.00)
GFR, Estimated: >60 mL/min (ref >60)
Glucose, Bld: 101 mg/dL — ABNORMAL HIGH (ref 70–99)
Potassium: 3.5 mmol/L (ref 3.5–5.1)
Sodium: 133 mmol/L — ABNORMAL LOW (ref 135–145)

## 2023-06-18 LAB — CBC
HCT: 32.5 % — ABNORMAL LOW (ref 36.0–46.0)
Hemoglobin: 11.3 g/dL — ABNORMAL LOW (ref 12.0–15.0)
MCH: 29.7 pg (ref 26.0–34.0)
MCHC: 34.8 g/dL (ref 30.0–36.0)
MCV: 85.3 fL (ref 80.0–100.0)
Platelets: 307 10*3/uL (ref 150–400)
RBC: 3.81 MIL/uL — ABNORMAL LOW (ref 3.87–5.11)
RDW: 12.3 % (ref 11.5–15.5)
WBC: 9.5 10*3/uL (ref 4.0–10.5)
nRBC: 0 % (ref 0.0–0.2)

## 2023-06-18 MED ORDER — ENOXAPARIN SODIUM 40 MG/0.4ML IJ SOSY: 40.0000 mg | PREFILLED_SYRINGE | INTRAMUSCULAR | 0 refills | Status: AC

## 2023-06-18 MED ORDER — OXYCODONE HCL 5 MG PO TABS
ORAL_TABLET | ORAL | Status: AC
  Filled 2023-06-18: qty 1

## 2023-06-18 MED ORDER — KETOROLAC TROMETHAMINE 15 MG/ML IJ SOLN
INTRAMUSCULAR | Status: AC
  Filled 2023-06-18: qty 1

## 2023-06-18 MED ORDER — CELECOXIB 200 MG PO CAPS: 200.0000 mg | ORAL_CAPSULE | Freq: Two times a day (BID) | ORAL | 0 refills | Status: AC

## 2023-06-18 MED ORDER — ACETAMINOPHEN 500 MG PO TABS: 1000.0000 mg | ORAL_TABLET | Freq: Three times a day (TID) | ORAL | 0 refills | Status: AC

## 2023-06-18 MED ORDER — PANTOPRAZOLE SODIUM 40 MG PO TBEC
DELAYED_RELEASE_TABLET | ORAL | Status: AC
  Filled 2023-06-18: qty 1

## 2023-06-18 MED ORDER — OXYCODONE HCL 5 MG PO TABS: 5.0000 mg | ORAL_TABLET | Freq: Four times a day (QID) | ORAL | 0 refills | Status: AC | PRN

## 2023-06-18 MED ORDER — VITAMIN D 25 MCG (1000 UNIT) PO TABS
ORAL_TABLET | ORAL | Status: AC
  Filled 2023-06-18: qty 1

## 2023-06-18 MED ORDER — DOCUSATE SODIUM 100 MG PO CAPS
ORAL_CAPSULE | ORAL | Status: AC
  Filled 2023-06-18: qty 1

## 2023-06-18 MED ORDER — ENOXAPARIN SODIUM 30 MG/0.3ML IJ SOSY
PREFILLED_SYRINGE | INTRAMUSCULAR | Status: AC
  Filled 2023-06-18: qty 0.3

## 2023-06-18 MED ORDER — DOCUSATE SODIUM 100 MG PO CAPS: 100.0000 mg | ORAL_CAPSULE | Freq: Two times a day (BID) | ORAL | 0 refills | Status: AC

## 2023-06-18 MED ORDER — TRAMADOL HCL 50 MG PO TABS: 50.0000 mg | ORAL_TABLET | Freq: Four times a day (QID) | ORAL | 0 refills | Status: AC | PRN

## 2023-06-18 MED ORDER — ACETAMINOPHEN 500 MG PO TABS
ORAL_TABLET | ORAL | Status: AC
  Filled 2023-06-18: qty 2

## 2023-06-18 MED ORDER — TRIAMTERENE-HCTZ 37.5-25 MG PO TABS
ORAL_TABLET | ORAL | Status: AC
  Filled 2023-06-18: qty 1

## 2023-06-18 MED ORDER — ONDANSETRON HCL 4 MG PO TABS: 4.0000 mg | ORAL_TABLET | Freq: Four times a day (QID) | ORAL | 0 refills | Status: AC | PRN

## 2023-06-18 NOTE — Anesthesia Postprocedure Evaluation (Signed)
Anesthesia Post Note  Patient: [facility]  Procedure(s) Performed: TOTAL KNEE ARTHROPLASTY (Left: Knee)  Patient location during evaluation: PACU Anesthesia Type: Spinal Level of consciousness: oriented and awake and alert Pain management: pain level controlled Vital Signs Assessment: post-procedure vital signs reviewed and stable Respiratory status: spontaneous breathing, respiratory function stable and patient connected to nasal cannula oxygen Cardiovascular status: blood pressure returned to baseline and stable Postop Assessment: no headache, no backache and no apparent nausea or vomiting Anesthetic complications: no   No notable events documented.   Last Vitals:  Vitals:   06/17/23 2359 06/18/23 0433  BP: (!) 142/70 (!) 143/54  Pulse:  95  Resp: 18 18  Temp: (!) 36.3 C 36.6 C  SpO2: 97% 94%    Last Pain:  Vitals:   06/18/23 0532  TempSrc:   PainSc: 4                  Corinda Gubler

## 2023-06-18 NOTE — TOC Initial Note (Signed)
Transition of Care [facility]) - Initial/Assessment Note    Patient Details  Name: [name] MRN: [id number] Date of Birth: [birth year]  Transition of Care [facility]) CM/SW Contact:    Marlowe Sax, RN Phone Number: 06/18/2023, 8:18 AM  Clinical Narrative:                 The patient lives at home with her spouse, she was set up with [facility] prior to surgery by surgeons office for [facility] to deliver a RW and 3 in 1 to the bedside prior to DC  Expected Discharge Plan: Home w Home Health Services Barriers to Discharge: No Barriers Identified   Patient Goals and CMS Choice            Expected Discharge Plan and Services   Discharge Planning Services: CM Consult   Living arrangements for the past 2 months: Single Family Home Expected Discharge Date: 06/18/23               DME Arranged: Dan Humphreys rolling, 3-N-1 DME Agency: [facility] Date DME Agency Contacted: 06/18/23 Time DME Agency Contacted: [phone] Representative spoke with at DME Agency: Cletis [place] HH Arranged: PT, OT [facility] Agency: [facility] Date HH Agency Contacted: 06/18/23 Time HH Agency Contacted: [phone] Representative spoke with at [facility] Agency: Morrie Sheldon  Prior Living Arrangements/Services Living arrangements for the past 2 months: Single Family Home Lives with:: Spouse Patient language and need for interpreter reviewed:: Yes Do you feel safe going back to the place where you live?: Yes      Need for Family Participation in Patient Care: Yes (Comment) Care giver support system in place?: Yes (comment) Current home services: DME (shower seat) Criminal Activity/Legal Involvement Pertinent to Current Situation/Hospitalization: No - Comment as needed  Activities of Daily Living   ADL Screening (condition at time of admission) Independently performs ADLs?: Yes (appropriate for developmental age) Is the patient deaf or have difficulty hearing?: No Does the patient have difficulty seeing, even when  wearing glasses/contacts?: No Does the patient have difficulty concentrating, remembering, or making decisions?: No  Permission Sought/Granted   Permission granted to share information with : Yes, Verbal Permission Granted              Emotional Assessment Appearance:: Appears stated age Attitude/Demeanor/Rapport: Engaged Affect (typically observed): Accepting Orientation: : Oriented to Self, Oriented to Place, Oriented to  Time, Oriented to Situation Alcohol / Substance Use: Not Applicable Psych Involvement: No (comment)  Admission diagnosis:  S/P TKR (total knee replacement), left [Z96.652] Patient Active Problem List   Diagnosis Date Noted   S/P TKR (total knee replacement), left 06/17/2023   Abnormal mammogram of right breast 09/04/2017   Visit for screening mammogram 08/27/2016   PCP:  Kandyce Rud, MD Pharmacy:   [phone] - [address], [place] - [address] [place] [address] Phone: [phone] Fax: [phone]     Social Determinants of Health (SDOH) Social History: SDOH Screenings   Food Insecurity: No Food Insecurity (06/17/2023)  Housing: Low Risk  (06/17/2023)  Transportation Needs: No Transportation Needs (06/17/2023)  Utilities: Not At Risk (06/17/2023)  Financial Resource Strain: Patient Declined (10/25/2022)   Received from [facility]  Tobacco Use: Low Risk  (06/17/2023)   SDOH Interventions:     Readmission Risk Interventions     No data to display

## 2023-06-18 NOTE — Progress Notes (Signed)
DISCHARGE NOTE:  Pt given discharge instructions and verbalized understanding. TED hose on both legs. BSC and walker sent with pt. Pt wheeled to car by staff, husband providing transportation.

## 2023-06-18 NOTE — Progress Notes (Signed)
   Subjective: 1 Day Post-Op Procedure(s) (LRB): TOTAL KNEE ARTHROPLASTY (Left) Patient reports pain as mild.   Patient is well, and has had no acute complaints or problems Denies any CP, SOB, ABD pain. We will continue therapy today.  Plan is to go Home after hospital stay.  Objective: Vital signs in last 24 hours: Temp:  [97.3 F (36.3 C)-98.2 F (36.8 C)] 97.8 F (36.6 C) (12/03 0433) Pulse Rate:  [70-95] 95 (12/03 0433) Resp:  [9-24] 18 (12/03 0433) BP: (109-143)/(47-70) 143/54 (12/03 0433) SpO2:  [94 %-100 %] 94 % (12/03 0433) Weight:  [100.2 kg] 100.2 kg (12/02 0932)  Intake/Output from previous day: 12/02 0701 - 12/03 0700 In: 700 [I.V.:700] Out: 50 [Blood:50] Intake/Output this shift: No intake/output data recorded.  Recent Labs    06/18/23 0543  HGB 11.3*   Recent Labs    06/18/23 0543  WBC 9.5  RBC 3.81*  HCT 32.5*  PLT 307   Recent Labs    06/18/23 0543  NA 133*  K 3.5  CL 99  CO2 26  BUN 12  CREATININE 0.76  GLUCOSE 101*  CALCIUM 7.9*   No results for input(s): "LABPT", "INR" in the last 72 hours.  EXAM General - Patient is Alert, Appropriate, and Oriented Extremity - Sensation intact distally Intact pulses distally Dorsiflexion/Plantar flexion intact No cellulitis present Compartment soft Dressing - dressing C/D/I and no drainage Motor Function - intact, moving foot and toes well on exam.   Past Medical History:  Diagnosis Date   Anxiety    Cystitis 07/17/1975   Depression    GERD (gastroesophageal reflux disease)    History of blood transfusion    after tonsillectomy-age 56   Hypertension    Hypothyroidism    Pre-diabetes    Sleep apnea    does not use-could not tolerate   Thyroid disease 07/16/1973    Assessment/Plan:   1 Day Post-Op Procedure(s) (LRB): TOTAL KNEE ARTHROPLASTY (Left) Principal Problem:   S/P TKR (total knee replacement), left  Estimated body mass index is 39.13 kg/m as calculated from the  following:   Height as of this encounter: 5\' 3"  (1.6 m).   Weight as of this encounter: 100.2 kg. Advance diet Up with therapy Pain well controlled Labs and Vital signs stable CM to assist with discharge to home with HHPT today pending safe completion of PT goals  DVT Prophylaxis - Lovenox, TED hose, and SCDs Weight-Bearing as tolerated to left leg   T. Cranston Neighbor, PA-C [facility] 06/18/2023, 8:01 AM

## 2023-06-18 NOTE — Plan of Care (Signed)
   Problem: Activity: Goal: Risk for activity intolerance will decrease Outcome: Progressing   Problem: Elimination: Goal: Will not experience complications related to urinary retention Outcome: Progressing   Problem: Pain Management: Goal: General experience of comfort will improve Outcome: Progressing

## 2023-06-18 NOTE — Discharge Instructions (Signed)

## 2023-06-18 NOTE — Progress Notes (Signed)
Patient is not able to walk the distance required to go the bathroom, or she is unable to safely negotiate stairs required to access the bathroom.  A 3in1 BSC will alleviate this problem.       T. Chris Rosalena Mccorry, PA-C [facility] 

## 2023-06-18 NOTE — Progress Notes (Signed)
Physical Therapy Treatment Patient Details Name: [name] MRN: [id number] DOB: 24-Dec-1960 Today's Date: 06/18/2023   History of Present Illness Pt is a 62 y.o. female s/p L TKA secondary to OA 06/17/23.  PMH includes sleep apnea, htn, thyroid disease.    PT Comments  Pt is able to get OOB, walk, complete stair training, to bathroom to void and return to bed with supervision.  She is taught up/down steps with 2 rails then with walker as she does not have accessible rails at home.  HEP is reviewed and completed along with education for expectations for recovery and safety at home.  Pt with no further questions and concerns.  Cleared for DC from a PT standpoint.  RN aware.   If plan is discharge home, recommend the following: A little help with walking and/or transfers;A little help with bathing/dressing/bathroom;Assistance with cooking/housework;Assist for transportation;Help with stairs or ramp for entrance   Can travel by private vehicle        Equipment Recommendations  Rolling walker (2 wheels);BSC/3in1    Recommendations for Other Services       Precautions / Restrictions Precautions Precautions: Fall;Knee Precaution Booklet Issued: Yes (comment) Restrictions Weight Bearing Restrictions: Yes LLE Weight Bearing: Weight bearing as tolerated     Mobility  Bed Mobility Overal bed mobility: Independent               Patient Response: Cooperative  Transfers Overall transfer level: Independent Equipment used: Rolling walker (2 wheels)                    Ambulation/Gait Ambulation/Gait assistance: Supervision Gait Distance (Feet): 200 Feet Assistive device: Rolling walker (2 wheels) Gait Pattern/deviations: Step-through pattern Gait velocity: WFL     General Gait Details: steady with no buckling noted   Stairs Stairs: Yes Stairs assistance: Supervision Stair Management: Two rails, Step to pattern, Backwards, Forwards, With walker Number of Stairs:  4 General stair comments: steps with rails and with RW as she has no rails at home   Wheelchair Mobility     Tilt Bed Tilt Bed Patient Response: Cooperative  Modified Rankin (Stroke Patients Only)       Balance Overall balance assessment: Modified Independent                                          Cognition Arousal: Alert Behavior During Therapy: WFL for tasks assessed/performed Overall Cognitive Status: Within Functional Limits for tasks assessed                                          Exercises Total Joint Exercises Goniometric ROM: 5-100 limited by bed rails Other Exercises Other Exercises: HEP demo and review x 10    General Comments        Pertinent Vitals/Pain Pain Assessment Pain Assessment: Faces Faces Pain Scale: Hurts little more Pain Location: L knee Pain Intervention(s): Limited activity within patient's tolerance, Monitored during session, Premedicated before session, Repositioned    Home Living                          Prior Function            PT Goals (current goals can now be found in the care  plan section) Progress towards PT goals: Progressing toward goals    Frequency    BID      PT Plan      Co-evaluation              AM-PAC PT "6 Clicks" Mobility   Outcome Measure  Help needed turning from your back to your side while in a flat bed without using bedrails?: None (Anticipated level of assist) Help needed moving from lying on your back to sitting on the side of a flat bed without using bedrails?: None (Anticipated level of assist) Help needed moving to and from a bed to a chair (including a wheelchair)?: None (Anticipated level of assist) Help needed standing up from a chair using your arms (e.g., wheelchair or bedside chair)?: None (Anticipated level of assist) Help needed to walk in hospital room?: None (Anticipated level of assist) Help needed climbing 3-5 steps with a  railing? : A Little (Anticipated level of assist) 6 Click Score: 23    End of Session   Activity Tolerance: Other (comment) (Limited d/t nausea) Patient left: in bed;with call bell/phone within reach;with bed alarm set;with SCD's reapplied;Other (comment) (L heel floating via towel roll; polar care in place) Nurse Communication: Mobility status;Precautions;Weight bearing status;Other (comment) (pt's pain and nausea) PT Visit Diagnosis: Other abnormalities of gait and mobility (R26.89);Muscle weakness (generalized) (M62.81);Pain Pain - Right/Left: Left Pain - part of body: Knee     Time: 1308-6578 PT Time Calculation (min) (ACUTE ONLY): 23 min  Charges:    $Gait Training: 8-22 mins $Therapeutic Exercise: 8-22 mins PT General Charges $$ ACUTE PT VISIT: 1 Visit                  Danielle Dess, PTA 06/18/23, 9:20 AM

## 2023-06-18 NOTE — Plan of Care (Signed)
  Problem: Activity: Goal: Risk for activity intolerance will decrease Outcome: Progressing   Problem: Coping: Goal: Level of anxiety will decrease Outcome: Progressing   Problem: Elimination: Goal: Will not experience complications related to urinary retention Outcome: Progressing   Problem: Pain Management: Goal: General experience of comfort will improve Outcome: Progressing   Problem: Activity: Goal: Ability to avoid complications of mobility impairment will improve Outcome: Progressing   Problem: Pain Management: Goal: Pain level will decrease with appropriate interventions Outcome: Progressing

## 2023-06-18 NOTE — Discharge Summary (Signed)
Physician Discharge Summary  Patient ID: [name] MRN: [id number] DOB/AGE: 63/15/1962 63 y.o.  Admit date: 06/17/2023 Discharge date: 06/18/2023  Admission Diagnoses:  S/P TKR (total knee replacement), left [Z96.652]   Discharge Diagnoses: Patient Active Problem List   Diagnosis Date Noted   S/P TKR (total knee replacement), left 06/17/2023   Abnormal mammogram of right breast 09/04/2017   Visit for screening mammogram 08/27/2016    Past Medical History:  Diagnosis Date   Anxiety    Cystitis 07/17/1975   Depression    GERD (gastroesophageal reflux disease)    History of blood transfusion    after tonsillectomy-age 45   Hypertension    Hypothyroidism    Pre-diabetes    Sleep apnea    does not use-could not tolerate   Thyroid disease 07/16/1973     Transfusion: none   Consultants (if any):   Discharged Condition: Improved  Hospital Course: [name] is an 61 y.o. female who was admitted 06/17/2023 with a diagnosis of S/P TKR (total knee replacement), left and went to the operating room on 06/17/2023 and underwent the above named procedures.    Surgeries: Procedure(s): TOTAL KNEE ARTHROPLASTY on 06/17/2023 Patient tolerated the surgery well. Taken to PACU where she was stabilized and then transferred to the orthopedic floor.  Started on Lovenox 30 mg q 12 hrs. TEDs and SCDs applied bilaterally. Heels elevated on bed. No evidence of DVT. Negative Homan. Physical therapy started on day #1 for gait training and transfer. OT started day #1 for ADL and assisted devices.  Patient's IV was d/c on day #1. Patient was able to safely and independently complete all PT goals. PT recommending discharge to home.    On post op day #1 patient was stable and ready for discharge to home with HHPT.  Implants:  Femur: Persona Size 7 CR PPS   Tibia: Persona Size E Keel spiked OsseoTi  Poly: 11 mm MC  Patella: 32x61mm symmetric 3 peg OsseoTi   She was given perioperative  antibiotics:  Anti-infectives (From admission, onward)    Start     Dose/Rate Route Frequency Ordered Stop   06/17/23 1630  ceFAZolin (ANCEF) IVPB 2g/100 mL premix        2 g 200 mL/hr over 30 Minutes Intravenous Every 6 hours 06/17/23 1418 06/17/23 2214   06/17/23 0815  vancomycin (VANCOCIN) IVPB 1000 mg/200 mL premix  Status:  Discontinued        1,000 mg 200 mL/hr over 60 Minutes Intravenous On call to O.R. 06/17/23 0811 06/17/23 1416   06/17/23 0815  levofloxacin (LEVAQUIN) IVPB 500 mg  Status:  Discontinued        500 mg 100 mL/hr over 60 Minutes Intravenous On call to O.R. 06/17/23 4098 06/17/23 1416     .  She was given sequential compression devices, early ambulation, and Lovenox TEDs for DVT prophylaxis.  She benefited maximally from the hospital stay and there were no complications.    Recent vital signs:  Vitals:   06/17/23 2359 06/18/23 0433  BP: (!) 142/70 (!) 143/54  Pulse:  95  Resp: 18 18  Temp: (!) 97.3 F (36.3 C) 97.8 F (36.6 C)  SpO2: 97% 94%    Recent laboratory studies:  Lab Results  Component Value Date   HGB 11.3 (L) 06/18/2023   Lab Results  Component Value Date   WBC 9.5 06/18/2023   PLT 307 06/18/2023   No results found for: "INR" Lab Results  Component Value  Date   NA 133 (L) 06/18/2023   K 3.5 06/18/2023   CL 99 06/18/2023   CO2 26 06/18/2023   BUN 12 06/18/2023   CREATININE 0.76 06/18/2023   GLUCOSE 101 (H) 06/18/2023    Discharge Medications:   Allergies as of 06/18/2023       Reactions   Cephalosporins Hives   Sulfa Antibiotics Other (See Comments)   dizziness        Medication List     STOP taking these medications    meloxicam 15 MG tablet Commonly known as: MOBIC       TAKE these medications    acetaminophen 500 MG tablet Commonly known as: TYLENOL Take 2 tablets (1,000 mg total) by mouth every 8 (eight) hours.   ascorbic acid 500 MG tablet Commonly known as: VITAMIN C Take 500 mg by mouth daily.    buPROPion 150 MG 24 hr tablet Commonly known as: WELLBUTRIN XL Take 150 mg by mouth every morning.   CALCIUM + VITAMIN D3 PO Take 1 tablet by mouth daily.   celecoxib 200 MG capsule Commonly known as: CeleBREX Take 1 capsule (200 mg total) by mouth 2 (two) times daily for 14 days.   cetirizine 10 MG tablet Commonly known as: ZYRTEC Take 10 mg by mouth at bedtime.   cholecalciferol 1000 units tablet Commonly known as: VITAMIN D Take 1,000 Units by mouth daily.   docusate sodium 100 MG capsule Commonly known as: COLACE Take 1 capsule (100 mg total) by mouth 2 (two) times daily.   enoxaparin 40 MG/0.4ML injection Commonly known as: LOVENOX Inject 0.4 mLs (40 mg total) into the skin daily for 14 days.   FLUoxetine 20 MG capsule Commonly known as: PROZAC Take 20 mg by mouth at bedtime.   fluticasone 50 MCG/ACT nasal spray Commonly known as: FLONASE Place 2 sprays into both nostrils every morning.   GLUCOSAMINE-CHONDROIT-MSM-C-MN PO Take 1 tablet by mouth every morning.   levothyroxine 137 MCG tablet Commonly known as: SYNTHROID Take 137 mcg by mouth daily before breakfast.   montelukast 10 MG tablet Commonly known as: SINGULAIR Take 10 mg by mouth at bedtime.   multivitamin with minerals tablet Take 1 tablet by mouth daily.   ondansetron 4 MG tablet Commonly known as: ZOFRAN Take 1 tablet (4 mg total) by mouth every 6 (six) hours as needed for nausea.   oxyCODONE 5 MG immediate release tablet Commonly known as: Oxy IR/ROXICODONE Take 1 tablet (5 mg total) by mouth every 6 (six) hours as needed for breakthrough pain.   pantoprazole 40 MG tablet Commonly known as: PROTONIX Take 40 mg by mouth every morning.   traMADol 50 MG tablet Commonly known as: ULTRAM Take 1 tablet (50 mg total) by mouth every 6 (six) hours as needed for moderate pain (pain score 4-6).   triamterene-hydrochlorothiazide 37.5-25 MG tablet Commonly known as: MAXZIDE-25 Take 1 tablet by  mouth every morning.               Durable Medical Equipment  (From admission, onward)           Start     Ordered   06/18/23 0805  For home use only DME 3 n 1  Once        06/18/23 0804   06/18/23 0804  For home use only DME Walker  Once       Question:  Patient needs a walker to treat with the following condition  Answer:  Total knee replacement  status   06/18/23 0804            Diagnostic Studies: DG Knee Left Port  Result Date: 06/17/2023 CLINICAL DATA:  Status post left knee arthroplasty. EXAM: PORTABLE LEFT KNEE - 1-2 VIEW COMPARISON:  None Available. FINDINGS: Interval total left knee arthroplasty. No perihardware lucency is seen to indicate hardware failure or loosening. Expected postoperative changes including intra-articular and subcutaneous air. Smalljoint effusion. No acute fracture or dislocation. IMPRESSION: Interval total left knee arthroplasty without evidence of hardware failure. Electronically Signed   By: Neita Garnet M.D.   On: 06/17/2023 13:51    Disposition:      Follow-up Information     Evon Slack, PA-C Follow up in 2 week(s).   Specialties: Orthopedic Surgery, Emergency Medicine Contact information: [address] [place] [address] [phone]                  Signed: Patience Musca 06/18/2023, 8:08 AM

## 2023-07-16 ENCOUNTER — Encounter: Payer: Self-pay | Admitting: Orthopedic Surgery

## 2024-02-20 ENCOUNTER — Other Ambulatory Visit: Payer: Self-pay | Admitting: Family Medicine

## 2024-02-20 DIAGNOSIS — Z1231 Encounter for screening mammogram for malignant neoplasm of breast: Secondary | ICD-10-CM

## 2024-03-10 ENCOUNTER — Ambulatory Visit
Admission: RE | Admit: 2024-03-10 | Discharge: 2024-03-10 | Disposition: A | Discharge status: Home / Self Care | Source: Ambulatory Visit | Attending: Family Medicine | Admitting: Family Medicine

## 2024-03-10 DIAGNOSIS — Z1231 Encounter for screening mammogram for malignant neoplasm of breast: Secondary | ICD-10-CM | POA: Diagnosis present

## 2024-04-28 ENCOUNTER — Ambulatory Visit

## 2024-04-28 DIAGNOSIS — Z83719 Family history of colon polyps, unspecified: Secondary | ICD-10-CM | POA: Diagnosis not present

## 2024-04-28 DIAGNOSIS — Z1211 Encounter for screening for malignant neoplasm of colon: Secondary | ICD-10-CM | POA: Diagnosis present

## 2024-04-28 DIAGNOSIS — K640 First degree hemorrhoids: Secondary | ICD-10-CM | POA: Diagnosis not present
